# Patient Record
Sex: Male | Born: 1955 | Race: White | Hispanic: No | Marital: Married | Smoking: Former smoker
Health system: Southern US, Community
[De-identification: ages and names within clinical notes are randomized; demographics above are authoritative.]

## PROBLEM LIST (undated history)

## (undated) DIAGNOSIS — G4730 Sleep apnea, unspecified: Secondary | ICD-10-CM

## (undated) DIAGNOSIS — F419 Anxiety disorder, unspecified: Secondary | ICD-10-CM

## (undated) DIAGNOSIS — Z87442 Personal history of urinary calculi: Secondary | ICD-10-CM

## (undated) DIAGNOSIS — E7800 Pure hypercholesterolemia, unspecified: Secondary | ICD-10-CM

## (undated) DIAGNOSIS — I10 Essential (primary) hypertension: Secondary | ICD-10-CM

## (undated) DIAGNOSIS — Z8489 Family history of other specified conditions: Secondary | ICD-10-CM

## (undated) DIAGNOSIS — M1990 Unspecified osteoarthritis, unspecified site: Secondary | ICD-10-CM

---
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
## 2004-04-10 ENCOUNTER — Ambulatory Visit: Payer: Self-pay | Admitting: Family Medicine

## 2004-10-20 ENCOUNTER — Emergency Department: Payer: Self-pay | Admitting: Emergency Medicine

## 2004-11-07 ENCOUNTER — Ambulatory Visit: Payer: Self-pay | Admitting: Specialist

## 2009-06-25 HISTORY — PX: COLONOSCOPY: SHX174

## 2010-05-30 ENCOUNTER — Ambulatory Visit: Payer: Self-pay | Admitting: Gastroenterology

## 2010-06-01 LAB — PATHOLOGY REPORT

## 2012-06-16 ENCOUNTER — Emergency Department: Payer: Self-pay | Admitting: Emergency Medicine

## 2018-12-28 ENCOUNTER — Ambulatory Visit
Admission: EM | Admit: 2018-12-28 | Discharge: 2018-12-28 | Disposition: A | Discharge status: Home / Self Care | Payer: BC Managed Care – PPO | Attending: Family Medicine | Admitting: Family Medicine

## 2018-12-28 ENCOUNTER — Other Ambulatory Visit: Payer: Self-pay

## 2018-12-28 ENCOUNTER — Encounter: Payer: Self-pay | Admitting: Emergency Medicine

## 2018-12-28 DIAGNOSIS — M7662 Achilles tendinitis, left leg: Secondary | ICD-10-CM | POA: Diagnosis not present

## 2018-12-28 HISTORY — DX: Essential (primary) hypertension: I10

## 2018-12-28 MED ORDER — MELOXICAM 15 MG PO TABS: 15.0000 mg | ORAL_TABLET | Freq: Every day | ORAL | 0 refills | Status: DC | PRN

## 2018-12-28 NOTE — Discharge Instructions (Addendum)
Take medication as prescribed. Rest. Drink plenty of fluids. Ice. Avoid strenuous activity. Avoid stepping motions like ladders as able. Use boot.   Follow up with podiatry in one week as discussed. See above.   Follow up with your primary care physician this week as needed. Return to Urgent care for new or worsening concerns.

## 2018-12-28 NOTE — ED Provider Notes (Addendum)
[facility] ____________________________________________  Time seen: Approximately 12:18 PM  I have reviewed the triage vital signs and the nursing notes.   HISTORY  Chief Complaint Foot Pain   HPI Spencer Smith is a 63 y.o. male presenting for evaluation of left posterior heel ankle pain since Friday.  Patient reports he has been very active recently and has been helping his father-in-law to repair a shed.  Does report he has been going up and down ladders which she does not typically do.  States this past Friday he noticed some pain to his heel while he was fishing.  Reports pain increased yesterday when he got out of bed.  States pain did improve during the day as he got active but then throbbing last night.  Denies any fall, direct injury or direct trauma.  Denies any abrupt onset.  Denies any decreased range of motion or swelling or break in skin.  Denies history of same.  No alleviating measures attempted.  Pain is worse with direct palpation.  Reports otherwise doing well.  Denies chest pain, shortness of breath, cough, fever or recent sickness.   Past Medical History:  Diagnosis Date  . Hypertension     There are no active problems to display for this patient.   History reviewed. No pertinent surgical history.   No current facility-administered medications for this encounter.   Current Outpatient Medications:  .  sertraline (ZOLOFT) 100 MG tablet, Take by mouth., Disp: , Rfl:  .  meloxicam (MOBIC) 15 MG tablet, Take 1 tablet (15 mg total) by mouth daily as needed for pain., Disp: 14 tablet, Rfl: 0  Allergies Patient has no known allergies.  History reviewed. No pertinent family history.  Social History Social History   Tobacco Use  . Smoking status: Former Research scientist (life sciences)  . Smokeless tobacco: Never Used  Substance Use Topics  . Alcohol use: Not Currently  . Drug use: Never    Review of Systems Constitutional: No fever ENT: No sore throat.  Cardiovascular: Denies chest pain. Respiratory: Denies shortness of breath. Gastrointestinal: No abdominal pain.   Musculoskeletal: Positive left heel pain. Skin: Negative for rash.   ____________________________________________   PHYSICAL EXAM:  VITAL SIGNS: ED Triage Vitals  Enc Vitals Group     BP 12/28/18 1132 125/76     Pulse Rate 12/28/18 1132 (!) 57     Resp 12/28/18 1132 18     Temp 12/28/18 1130 98.2 F (36.8 C)     Temp Source 12/28/18 1130 Oral     SpO2 12/28/18 1132 97 %     Weight 12/28/18 1131 223 lb (101.2 kg)     Height 12/28/18 1131 6\' 1"  (1.854 m)     Head Circumference --      Peak Flow --      Pain Score 12/28/18 1131 7     Pain Loc --      Pain Edu? --      Excl. in [place]? --     Constitutional: Alert and oriented. Well appearing and in no acute distress. ENT      Head: Normocephalic and atraumatic. Cardiovascular: Normal rate, regular rhythm. Grossly normal heart sounds.  Good peripheral circulation. Respiratory: Normal respiratory effort without tachypnea nor retractions. Breath sounds are clear and equal bilaterally. No wheezes, rales, rhonchi. Musculoskeletal: Steady gait.  Bilateral distal pedal pulses equal nasal palpated. Except: Left posterior heel at insertion of Achilles tendon mild to moderate tenderness to direct palpation, no bony tenderness, negative left  Thompson squeeze test, able to fully plantarflex and dorsiflex left foot with minimal pain to posterior heel, left lower extremity otherwise nontender, no edema.  Neurologic:  Normal speech and language.  Speech is normal. No gait instability.  Skin:  Skin is warm, dry Psychiatric: Mood and affect are normal. Speech and behavior are normal. Patient exhibits appropriate insight and judgment   ___________________________________________   LABS (all labs ordered are listed, but only abnormal results are displayed)  Labs Reviewed - No data to display  ____________________________________________   PROCEDURES Procedures     INITIAL IMPRESSION / ASSESSMENT AND PLAN / ED COURSE  Pertinent labs & imaging results that were available during my care of the patient were reviewed by me and considered in my medical decision making (see chart for details).  Very well-appearing patient.  No acute distress.  Recent increase of activity going up and down steps.  Onset of left posterior heel pain at distal Achilles insertion point.  No bony tenderness.  No trauma.  Will defer x-ray at this time, patient agrees.  Suspect left Achilles tendinitis.  Will treat with Mobic, ice, rest, supportive care.  Cam walker boot given.  Follow-up with podiatry in 1 week for continued pain.Discussed indication, risks and benefits of medications with patient.  Discussed follow up with Primary care physician this week. Discussed follow up and return parameters including no resolution or any worsening concerns. Patient verbalized understanding and agreed to plan.   ____________________________________________   FINAL CLINICAL IMPRESSION(S) / ED DIAGNOSES  Final diagnoses:  Left Achilles tendinitis     ED Discharge Orders         Ordered    meloxicam (MOBIC) 15 MG tablet  Daily PRN     12/28/18 1155           Note: This dictation was prepared with Dragon dictation along with smaller phrase technology. Any transcriptional errors that result from this process are unintentional.         Marylene Land, NP 12/28/18 1225

## 2018-12-28 NOTE — ED Triage Notes (Signed)
Patient states Friday afternoon his left heel started hurting him. He denies injury and states it is painful to walk.

## 2020-03-24 ENCOUNTER — Other Ambulatory Visit: Payer: Self-pay | Admitting: Urology

## 2020-03-24 DIAGNOSIS — R972 Elevated prostate specific antigen [PSA]: Secondary | ICD-10-CM

## 2020-04-13 ENCOUNTER — Ambulatory Visit
Admission: RE | Admit: 2020-04-13 | Discharge: 2020-04-13 | Disposition: A | Discharge status: Home / Self Care | Payer: BC Managed Care – PPO | Source: Ambulatory Visit | Attending: Urology | Admitting: Urology

## 2020-04-13 ENCOUNTER — Other Ambulatory Visit: Payer: Self-pay

## 2020-04-13 DIAGNOSIS — R972 Elevated prostate specific antigen [PSA]: Secondary | ICD-10-CM | POA: Insufficient documentation

## 2020-04-13 MED ORDER — GADOBUTROL 1 MMOL/ML IV SOLN
10.0000 mL | Freq: Once | INTRAVENOUS | Status: AC | PRN
  Administered 2020-04-13: 10 mL via INTRAVENOUS

## 2020-05-16 ENCOUNTER — Encounter
Admission: RE | Admit: 2020-05-16 | Discharge: 2020-05-16 | Disposition: A | Discharge status: Home / Self Care | Payer: BC Managed Care – PPO | Source: Ambulatory Visit | Attending: Urology | Admitting: Urology

## 2020-05-16 ENCOUNTER — Other Ambulatory Visit: Payer: Self-pay

## 2020-05-16 HISTORY — DX: Personal history of urinary calculi: Z87.442

## 2020-05-16 HISTORY — DX: Pure hypercholesterolemia, unspecified: E78.00

## 2020-05-16 HISTORY — DX: Anxiety disorder, unspecified: F41.9

## 2020-05-16 HISTORY — DX: Sleep apnea, unspecified: G47.30

## 2020-05-16 NOTE — Patient Instructions (Addendum)
Your procedure is scheduled on: Thursday, December 2 Report to the Registration Desk on the 1st floor of the [facility]. To find out your arrival time, please call [phone] between 1PM - 3PM on: Wednesday, December 1  REMEMBER: Instructions that are not followed completely may result in serious medical risk, up to and including death; or upon the discretion of your surgeon and anesthesiologist your surgery may need to be rescheduled.  Do not eat food after midnight the night before surgery.  No gum chewing, lozengers or hard candies.  You may however, drink CLEAR liquids up to 2 hours before you are scheduled to arrive for your surgery. Do not drink anything within 2 hours of your scheduled arrival time.  Clear liquids include: - water  - apple juice without pulp - gatorade (not RED, PURPLE, OR BLUE) - black coffee or tea (Do NOT add milk or creamers to the coffee or tea) Do NOT drink anything that is not on this list.  TAKE THESE MEDICATIONS THE MORNING OF SURGERY WITH A SIP OF WATER:  1.  Amlodipine 2.  Buspirone (Buspar) 3.  Fluoxetine (Prozac)  One week prior to surgery: starting November 25 Stop aspirin and Anti-inflammatories (NSAIDS) such as Advil, Aleve, Ibuprofen, Motrin, Naproxen, Naprosyn and Aspirin based products such as Excedrin, Goodys Powder, BC Powder. Stop ANY OVER THE COUNTER supplements until after surgery. Stop fish oil (However, you may continue taking Vitamin D and multivitamin up until the day before surgery.)  No Alcohol for 24 hours before or after surgery.  No Smoking including e-cigarettes for 24 hours prior to surgery.  No chewable tobacco products for at least 6 hours prior to surgery.  No nicotine patches on the day of surgery.  Do not use any "recreational" drugs for at least a week prior to your surgery.  Please be advised that the combination of cocaine and anesthesia may have negative outcomes, up to and including death. If you test  positive for cocaine, your surgery will be cancelled.  On the morning of surgery brush your teeth with toothpaste and water, you may rinse your mouth with mouthwash if you wish. Do not swallow any toothpaste or mouthwash.  Do not wear jewelry.  Do not wear lotions, powders, or perfumes.   Do not bring valuables to the hospital. [facility] is not responsible for any missing/lost belongings or valuables.   Fleets enema as directed prior to arrival to the hospital on the morning of surgery.  Bring your C-PAP to the hospital with you in case you may have to spend the night.   Notify your doctor if there is any change in your medical condition (cold, fever, infection).  Wear comfortable clothing (specific to your surgery type) to the hospital.  If you are being discharged the day of surgery, you will not be allowed to drive home. You will need a responsible adult (18 years or older) to drive you home and stay with you that night.   If you are taking public transportation, you will need to have a responsible adult (18 years or older) with you. Please confirm with your physician that it is acceptable to use public transportation.   Please call the [facility] at [phone] if you have any questions about these instructions.  Visitation Policy:  Patients undergoing a surgery or procedure may have one family member or support person with them as long as that person is not COVID-19 positive or experiencing its symptoms.  That  person may remain in the waiting area during the procedure.

## 2020-05-17 ENCOUNTER — Encounter
Admission: RE | Admit: 2020-05-17 | Discharge: 2020-05-17 | Disposition: A | Discharge status: Home / Self Care | Payer: BC Managed Care – PPO | Source: Ambulatory Visit | Attending: Urology | Admitting: Urology

## 2020-05-17 DIAGNOSIS — I10 Essential (primary) hypertension: Secondary | ICD-10-CM | POA: Diagnosis not present

## 2020-05-17 DIAGNOSIS — Z01818 Encounter for other preprocedural examination: Secondary | ICD-10-CM | POA: Diagnosis present

## 2020-05-17 LAB — CBC
HCT: 43.2 % (ref 39.0–52.0)
Hemoglobin: 15 g/dL (ref 13.0–17.0)
MCH: 31.1 pg (ref 26.0–34.0)
MCHC: 34.7 g/dL (ref 30.0–36.0)
MCV: 89.4 fL (ref 80.0–100.0)
Platelets: 195 10*3/uL (ref 150–400)
RBC: 4.83 MIL/uL (ref 4.22–5.81)
RDW: 12.3 % (ref 11.5–15.5)
WBC: 5.5 10*3/uL (ref 4.0–10.5)
nRBC: 0 % (ref 0.0–0.2)

## 2020-05-17 LAB — BASIC METABOLIC PANEL WITH GFR
Anion gap: 10 (ref 5–15)
BUN: 12 mg/dL (ref 8–23)
CO2: 26 mmol/L (ref 22–32)
Calcium: 9.3 mg/dL (ref 8.9–10.3)
Chloride: 102 mmol/L (ref 98–111)
Creatinine, Ser: 0.91 mg/dL (ref 0.61–1.24)
GFR, Estimated: >60 mL/min (ref >60)
Glucose, Bld: 98 mg/dL (ref 70–99)
Potassium: 3.8 mmol/L (ref 3.5–5.1)
Sodium: 138 mmol/L (ref 135–145)

## 2020-05-18 NOTE — H&P (Signed)
NAME: Spencer Smith, Spencer Smith. MEDICAL RECORD [id number] ACCOUNT [id number] DATE OF BIRTH:Nov 12, 1955 FACILITY: [facility] LOCATION: [facility] PHYSICIAN:Hendryx Ricke R. Izabella Marcantel, MD  HISTORY AND PHYSICAL  DATE OF ADMISSION:  05/26/2020  Same day surgery, 05/26/2020  CHIEF COMPLAINT:  Elevated PSA and abnormal prostate MRI.  HISTORY OF PRESENT ILLNESS:  The patient is a 64 year old white male with an elevated PSA of 4.1 ng/mL, which was evaluated with prostate MRI scan.  The MRI indicated 2 regions of interest.  The first region of interest was a PI-RADS category 4 lesion  in the right anterior zone measuring 0.94 mL.  The region of interest #2 was a PI-RADS category 4 lesion of the left posterior lateral and left anterior peripheral zone in the mid gland with a volume of 0.61 mL.  The total prostate volume was 69.95 mL.   The patient comes in now for UroNav fusion biopsy of the prostate.  PAST MEDICAL HISTORY: ALLERGIES:  No drug allergies.  CURRENT MEDICATIONS:  Aspirin, atorvastatin, BuSpar, fluoxetine and HCTZ.  PAST SURGICAL HISTORY:  Tonsillectomy in 1961.  PAST AND CURRENT MEDICAL CONDITIONS: 1.  Hypertension. 2.  Hypercholesterolemia. 3.  Depression with anxiety. 4.  Sleep apnea. 5.  Obesity.  REVIEW OF SYSTEMS:  The patient denies chest pain, shortness of breath, diabetes, stroke or heart disease.  SOCIAL HISTORY:  The patient quit smoking in 2020 with a 20-pack-year history.  He denied alcohol use.  FAMILY HISTORY:  Father died at age 39 with dementia.  Mother is living, age 81 with hypertension and hypercholesterolemia.  There is no family history of prostate cancer, other urological cancer or kidney stones or kidney disease.  PHYSICAL EXAMINATION: VITAL SIGNS:  Height 6 feet 0 inches, weight 219 pounds, BMI 30. GENERAL:  Obese white male in no acute distress. HEENT:  Sclerae were clear.  Pupils are equally round, reactive to light and accommodation.  Extraocular movements were  intact. NECK:  No palpable cervical adenopathy.  No audible carotid bruits. LYMPHATIC:  No palpable cervical or inguinal adenopathy. PULMONARY:  Lungs were clear to auscultation with normal respiratory effort without intercostal retractions. CARDIOVASCULAR:  Regular rhythm and rate without audible murmurs or gallops. GASTROINTESTINAL:  Abdomen was soft, without CVA tenderness. GENITOURINARY:  Circumcised.  Testes were smooth, nontender, approximately 18 mL in size each. RECTAL:  Greater than 50 gram smooth, nontender prostate. NEUROMUSCULAR:  Alert and oriented x3.  IMPRESSION: 1.  Elevated PSA. 2.  Abnormal prostate MRI scan.  PLAN:  UroNav fusion biopsy of prostate.  HN/NUANCE  D:05/17/2020 T:05/17/2020 JOB:[id number]

## 2020-05-24 ENCOUNTER — Other Ambulatory Visit
Admission: RE | Admit: 2020-05-24 | Discharge: 2020-05-24 | Disposition: A | Discharge status: Home / Self Care | Payer: BC Managed Care – PPO | Source: Ambulatory Visit | Attending: Urology | Admitting: Urology

## 2020-05-24 ENCOUNTER — Other Ambulatory Visit: Payer: Self-pay

## 2020-05-24 DIAGNOSIS — Z20822 Contact with and (suspected) exposure to covid-19: Secondary | ICD-10-CM | POA: Diagnosis present

## 2020-05-24 DIAGNOSIS — Z01812 Encounter for preprocedural laboratory examination: Secondary | ICD-10-CM | POA: Diagnosis not present

## 2020-05-24 LAB — SARS CORONAVIRUS 2 (TAT 6-24 HRS): SARS Coronavirus 2: NEGATIVE

## 2020-05-25 MED ORDER — CHLORHEXIDINE GLUCONATE 0.12 % MT SOLN: 15.0000 mL | Freq: Once | OROMUCOSAL | Status: AC

## 2020-05-25 MED ORDER — CEFAZOLIN SODIUM-DEXTROSE 1-4 GM/50ML-% IV SOLN
1.0000 g | Freq: Once | INTRAVENOUS | Status: AC
  Administered 2020-05-26: 2 g via INTRAVENOUS

## 2020-05-25 MED ORDER — FAMOTIDINE 20 MG PO TABS: 20.0000 mg | ORAL_TABLET | Freq: Once | ORAL | Status: AC

## 2020-05-25 MED ORDER — GENTAMICIN SULFATE 40 MG/ML IJ SOLN
80.0000 mg | Freq: Once | INTRAVENOUS | Status: DC
  Filled 2020-05-25: qty 2

## 2020-05-25 MED ORDER — GENTAMICIN IN SALINE 1.6-0.9 MG/ML-% IV SOLN
80.0000 mg | INTRAVENOUS | Status: AC
  Administered 2020-05-26: 80 mg via INTRAVENOUS
  Filled 2020-05-25: qty 50

## 2020-05-25 MED ORDER — ORAL CARE MOUTH RINSE: 15.0000 mL | Freq: Once | OROMUCOSAL | Status: AC

## 2020-05-25 MED ORDER — FLEET ENEMA 7-19 GM/118ML RE ENEM
1.0000 | ENEMA | Freq: Once | RECTAL | Status: AC
  Administered 2020-05-26: 1 via RECTAL

## 2020-05-25 MED ORDER — LACTATED RINGERS IV SOLN: INTRAVENOUS | Status: DC

## 2020-05-26 ENCOUNTER — Encounter: Payer: Self-pay | Admitting: Urology

## 2020-05-26 ENCOUNTER — Ambulatory Visit
Admission: RE | Admit: 2020-05-26 | Discharge: 2020-05-26 | Disposition: A | Discharge status: Home / Self Care | Payer: Medicare Other | Attending: Urology | Admitting: Urology

## 2020-05-26 ENCOUNTER — Other Ambulatory Visit: Payer: Self-pay | Admitting: Urology

## 2020-05-26 ENCOUNTER — Other Ambulatory Visit: Payer: Self-pay

## 2020-05-26 ENCOUNTER — Encounter
Admission: RE | Disposition: A | Discharge status: Home / Self Care | Payer: Self-pay | Source: Home / Self Care | Attending: Urology

## 2020-05-26 ENCOUNTER — Ambulatory Visit: Payer: Medicare Other | Admitting: Certified Registered"

## 2020-05-26 DIAGNOSIS — Z8249 Family history of ischemic heart disease and other diseases of the circulatory system: Secondary | ICD-10-CM | POA: Diagnosis not present

## 2020-05-26 DIAGNOSIS — Z87442 Personal history of urinary calculi: Secondary | ICD-10-CM | POA: Diagnosis not present

## 2020-05-26 DIAGNOSIS — I10 Essential (primary) hypertension: Secondary | ICD-10-CM | POA: Insufficient documentation

## 2020-05-26 DIAGNOSIS — Z820 Family history of epilepsy and other diseases of the nervous system: Secondary | ICD-10-CM | POA: Insufficient documentation

## 2020-05-26 DIAGNOSIS — Z87891 Personal history of nicotine dependence: Secondary | ICD-10-CM | POA: Diagnosis not present

## 2020-05-26 DIAGNOSIS — R9389 Abnormal findings on diagnostic imaging of other specified body structures: Secondary | ICD-10-CM | POA: Diagnosis not present

## 2020-05-26 DIAGNOSIS — R972 Elevated prostate specific antigen [PSA]: Secondary | ICD-10-CM | POA: Diagnosis present

## 2020-05-26 DIAGNOSIS — Z79899 Other long term (current) drug therapy: Secondary | ICD-10-CM | POA: Diagnosis not present

## 2020-05-26 DIAGNOSIS — Z8349 Family history of other endocrine, nutritional and metabolic diseases: Secondary | ICD-10-CM | POA: Insufficient documentation

## 2020-05-26 DIAGNOSIS — Z7982 Long term (current) use of aspirin: Secondary | ICD-10-CM | POA: Insufficient documentation

## 2020-05-26 HISTORY — PX: PROSTATE BIOPSY: SHX241

## 2020-05-26 SURGERY — BIOPSY, PROSTATE
Anesthesia: General

## 2020-05-26 MED ORDER — FENTANYL CITRATE (PF) 100 MCG/2ML IJ SOLN: 25.0000 ug | INTRAMUSCULAR | Status: DC | PRN

## 2020-05-26 MED ORDER — MIDAZOLAM HCL 5 MG/5ML IJ SOLN
INTRAMUSCULAR | Status: DC | PRN
  Administered 2020-05-26: 2 mg via INTRAVENOUS

## 2020-05-26 MED ORDER — DEXAMETHASONE SODIUM PHOSPHATE 10 MG/ML IJ SOLN
INTRAMUSCULAR | Status: DC | PRN
  Administered 2020-05-26: 4 mg via INTRAVENOUS

## 2020-05-26 MED ORDER — FENTANYL CITRATE (PF) 250 MCG/5ML IJ SOLN
INTRAMUSCULAR | Status: AC
  Filled 2020-05-26: qty 5

## 2020-05-26 MED ORDER — ONDANSETRON HCL 4 MG/2ML IJ SOLN
INTRAMUSCULAR | Status: DC | PRN
  Administered 2020-05-26: 4 mg via INTRAVENOUS

## 2020-05-26 MED ORDER — DEXAMETHASONE SODIUM PHOSPHATE 10 MG/ML IJ SOLN
INTRAMUSCULAR | Status: AC
  Filled 2020-05-26: qty 1

## 2020-05-26 MED ORDER — BELLADONNA ALKALOIDS-OPIUM 16.2-60 MG RE SUPP
RECTAL | Status: AC
  Filled 2020-05-26: qty 1

## 2020-05-26 MED ORDER — ONDANSETRON HCL 4 MG/2ML IJ SOLN
INTRAMUSCULAR | Status: AC
  Filled 2020-05-26: qty 2

## 2020-05-26 MED ORDER — PHENYLEPHRINE HCL (PRESSORS) 10 MG/ML IV SOLN
INTRAVENOUS | Status: AC
  Filled 2020-05-26: qty 1

## 2020-05-26 MED ORDER — GLYCOPYRROLATE 0.2 MG/ML IJ SOLN
INTRAMUSCULAR | Status: AC
  Filled 2020-05-26: qty 2

## 2020-05-26 MED ORDER — ROCURONIUM BROMIDE 100 MG/10ML IV SOLN
INTRAVENOUS | Status: DC | PRN
  Administered 2020-05-26: 10 mg via INTRAVENOUS
  Administered 2020-05-26: 30 mg via INTRAVENOUS

## 2020-05-26 MED ORDER — FENTANYL CITRATE (PF) 100 MCG/2ML IJ SOLN
INTRAMUSCULAR | Status: DC | PRN
  Administered 2020-05-26: 50 ug via INTRAVENOUS

## 2020-05-26 MED ORDER — ONDANSETRON HCL 4 MG/2ML IJ SOLN: 4.0000 mg | Freq: Once | INTRAMUSCULAR | Status: DC | PRN

## 2020-05-26 MED ORDER — CHLORHEXIDINE GLUCONATE 0.12 % MT SOLN
OROMUCOSAL | Status: AC
  Administered 2020-05-26: 15 mL via OROMUCOSAL
  Filled 2020-05-26: qty 15

## 2020-05-26 MED ORDER — ROCURONIUM BROMIDE 10 MG/ML (PF) SYRINGE
PREFILLED_SYRINGE | INTRAVENOUS | Status: AC
  Filled 2020-05-26: qty 10

## 2020-05-26 MED ORDER — SUCCINYLCHOLINE CHLORIDE 200 MG/10ML IV SOSY
PREFILLED_SYRINGE | INTRAVENOUS | Status: AC
  Filled 2020-05-26: qty 10

## 2020-05-26 MED ORDER — LEVOFLOXACIN 500 MG PO TABS: 500.0000 mg | ORAL_TABLET | Freq: Every day | ORAL | 0 refills | Status: DC

## 2020-05-26 MED ORDER — LIDOCAINE HCL (PF) 2 % IJ SOLN
INTRAMUSCULAR | Status: AC
  Filled 2020-05-26: qty 5

## 2020-05-26 MED ORDER — FAMOTIDINE 20 MG PO TABS
ORAL_TABLET | ORAL | Status: AC
  Administered 2020-05-26: 20 mg via ORAL
  Filled 2020-05-26: qty 1

## 2020-05-26 MED ORDER — SUGAMMADEX SODIUM 200 MG/2ML IV SOLN
INTRAVENOUS | Status: DC | PRN
  Administered 2020-05-26 (×3): 100 mg via INTRAVENOUS

## 2020-05-26 MED ORDER — PROPOFOL 10 MG/ML IV BOLUS
INTRAVENOUS | Status: AC
  Filled 2020-05-26: qty 40

## 2020-05-26 MED ORDER — GLYCOPYRROLATE 0.2 MG/ML IJ SOLN
INTRAMUSCULAR | Status: DC | PRN
  Administered 2020-05-26: .2 mg via INTRAVENOUS

## 2020-05-26 MED ORDER — MIDAZOLAM HCL 2 MG/2ML IJ SOLN
INTRAMUSCULAR | Status: AC
  Filled 2020-05-26: qty 2

## 2020-05-26 MED ORDER — SUCCINYLCHOLINE CHLORIDE 200 MG/10ML IV SOSY
PREFILLED_SYRINGE | INTRAVENOUS | Status: DC | PRN
  Administered 2020-05-26: 100 mg via INTRAVENOUS

## 2020-05-26 MED ORDER — LIDOCAINE HCL URETHRAL/MUCOSAL 2 % EX GEL
CUTANEOUS | Status: AC
  Filled 2020-05-26: qty 10

## 2020-05-26 MED ORDER — CEFAZOLIN SODIUM-DEXTROSE 1-4 GM/50ML-% IV SOLN
INTRAVENOUS | Status: AC
  Filled 2020-05-26: qty 50

## 2020-05-26 MED ORDER — PROPOFOL 10 MG/ML IV BOLUS
INTRAVENOUS | Status: DC | PRN
  Administered 2020-05-26: 130 mg via INTRAVENOUS

## 2020-05-26 SURGICAL SUPPLY — 20 items
COVER MAYO STAND REUSABLE (DRAPES) ×3 IMPLANT
COVER TRANSDUCER ULTRASOUND (MISCELLANEOUS) ×2 IMPLANT
GLOVE BIO SURGEON STRL SZ7 (GLOVE) ×6 IMPLANT
GUIDE NDL ENDOCAV 16-18 CVR (NEEDLE) IMPLANT
GUIDE NDL URONAV ULTRASND S (MISCELLANEOUS) IMPLANT
GUIDE NEEDLE ENDOCAV 16-18 CVR (NEEDLE) IMPLANT
GUIDE NEEDLE URONAV ULTRASND S (MISCELLANEOUS) ×1 IMPLANT
INST BIOPSY MAXCORE 18GX25 (NEEDLE) ×3 IMPLANT
MANIFOLD NEPTUNE II (INSTRUMENTS) ×3 IMPLANT
NDL GUIDE BIOPSY 644068 (NEEDLE) IMPLANT
NEEDLE GUIDE BIOPSY 644068 (NEEDLE) IMPLANT
PROBE BIOSP ALOKA ALPHA6 PROST (MISCELLANEOUS) ×2 IMPLANT
PROBE URONAV BK 8808E 8818 HLD (MISCELLANEOUS) IMPLANT
STRAP SAFETY BODY (MISCELLANEOUS) ×3 IMPLANT
SURGILUBE 2OZ TUBE FLIPTOP (MISCELLANEOUS) ×3 IMPLANT
TOWEL OR 17X26 4PK STRL BLUE (TOWEL DISPOSABLE) ×3 IMPLANT
URONAV BK 8808E 8818 PROBE HLD (MISCELLANEOUS) ×3
URONAV MRI FUSION TWO PATIENTS (MISCELLANEOUS) ×2 IMPLANT
URONAV ULTRASOUND (MISCELLANEOUS) ×2 IMPLANT
URONAV ULTRASOUND NDL GUIDE S (MISCELLANEOUS) ×3

## 2020-05-26 NOTE — Anesthesia Preprocedure Evaluation (Signed)
Anesthesia Evaluation  Patient identified by MRN, date of birth, ID band Patient awake    Reviewed: Allergy & Precautions, NPO status , Patient's Chart, lab work & pertinent test results  Airway Mallampati: III  TM Distance: <3 FB     Dental  (+) Caps   Pulmonary sleep apnea , former smoker,    Pulmonary exam normal        Cardiovascular hypertension, Normal cardiovascular exam     Neuro/Psych PSYCHIATRIC DISORDERS Anxiety negative neurological ROS     GI/Hepatic negative GI ROS, Neg liver ROS,   Endo/Other  negative endocrine ROS  Renal/GU negative Renal ROS     Musculoskeletal negative musculoskeletal ROS (+)   Abdominal Normal abdominal exam  (+)   Peds negative pediatric ROS (+)  Hematology negative hematology ROS (+)   Anesthesia Other Findings Past Medical History: No date: Anxiety No date: History of kidney stones No date: Hypercholesterolemia No date: Hypertension No date: Sleep apnea  Reproductive/Obstetrics                             Anesthesia Physical Anesthesia Plan  ASA: II  Anesthesia Plan: General   Post-op Pain Management:    Induction: Intravenous  PONV Risk Score and Plan:   Airway Management Planned: Oral ETT  Additional Equipment:   Intra-op Plan:   Post-operative Plan: Extubation in OR  Informed Consent: I have reviewed the patients History and Physical, chart, labs and discussed the procedure including the risks, benefits and alternatives for the proposed anesthesia with the patient or authorized representative who has indicated his/her understanding and acceptance.     Dental advisory given  Plan Discussed with: CRNA and Anesthesiologist  Anesthesia Plan Comments:         Anesthesia Quick Evaluation

## 2020-05-26 NOTE — Op Note (Signed)
Preoperative diagnosis:  1. Elevated PSA (R97.2)                                              2. Abnormal prostate MRI (D40.0)  Postoperative diagnosis: Same  Procedure: Uronav image guided fusion biopsy of the prostate (CPT 55700, [phone])  Surgeon: Otelia Limes. Yves Dill MD  Anesthesia: General  Indications:See the history and physical. After informed consent the above procedure(s) were requested     Technique and findings: After adequate general anesthesia been obtained the patient was placed into left lateral decubitus position and DRE was performed.  The rectal vault was noted to be clear.  Transrectal ultrasound probe was placed and ultrasound images acquired.  The ultrasound images were then fused with the MRI images and all 3 regions of interest were identified.  Three core biopsies were then obtained from region of interest #1.  Next, 3 core biopsies were taken from region of interest #2 followed by 3 core biopsies from region of interest #3.  At this point standard 12 core systematic core biopsies were performed.  Blood loss was minimal.  The ultrasound probe was removed and procedure terminated.  The patient was then transferred to the recovery room in stable condition.

## 2020-05-26 NOTE — Anesthesia Procedure Notes (Signed)
Procedure Name: Intubation Date/Time: 05/26/2020 7:37 AM Performed by: Adalberto Ill, CRNA Pre-anesthesia Checklist: Patient identified, Emergency Drugs available, Suction available, Patient being monitored and Timeout performed Patient Re-evaluated:Patient Re-evaluated prior to induction Oxygen Delivery Method: Circle system utilized Preoxygenation: Pre-oxygenation with 100% oxygen Induction Type: IV induction Ventilation: Mask ventilation without difficulty Laryngoscope Size: Mac, 3 and McGraph Grade View: Grade I Tube type: Oral Tube size: 7.5 mm Number of attempts: 1 (brief ) Airway Equipment and Method: Stylet Placement Confirmation: ETT inserted through vocal cords under direct vision,  positive ETCO2 and breath sounds checked- equal and bilateral Secured at: 23 cm Tube secured with: Tape Dental Injury: Teeth and Oropharynx as per pre-operative assessment

## 2020-05-26 NOTE — H&P (Signed)
Date of Initial H&P: 05/17/20  History reviewed, patient examined, no change in status, stable for surgery.

## 2020-05-26 NOTE — Discharge Instructions (Addendum)
AMBULATORY SURGERY  DISCHARGE INSTRUCTIONS   1) The drugs that you were given will stay in your system until tomorrow so for the next 24 hours you should not:  A) Drive an automobile B) Make any legal decisions C) Drink any alcoholic beverage   2) You may resume regular meals tomorrow.  Today it is better to start with liquids and gradually work up to solid foods.  You may eat anything you prefer, but it is better to start with liquids, then soup and crackers, and gradually work up to solid foods.   3) Please notify your doctor immediately if you have any unusual bleeding, trouble breathing, redness and pain at the surgery site, drainage, fever, or pain not relieved by medication.    4) Additional Instructions:   Please contact your physician with any problems or Same Day Surgery at [phone], Monday through Friday 6 am to 4 pm, or [place] [facility] number at [phone].Prostate-Specific Antigen Test Why am I having this test? The prostate-specific antigen (PSA) test is a screening test for prostate cancer. It can identify early signs of prostate cancer, which may allow for more effective treatment. Your health care provider may recommend that you have a PSA test starting at age 61 or that you have one earlier or later, depending on your risk factors for prostate cancer. You may also have a PSA test:  To monitor treatment of prostate cancer.  To check whether prostate cancer has returned after treatment.  If you have signs of other conditions that can affect PSA levels, such as: ? An enlarged prostate that is not caused by cancer (benign prostatic hyperplasia, BPH). This condition is very common in older men. ? A prostate infection. What is being tested? This test measures the amount of PSA in your blood. PSA is a protein that is made in the prostate. The prostate naturally produces more PSA as you age, but very high levels may be a sign of a medical  condition. What kind of sample is taken?  A blood sample is required for this test. It is usually collected by inserting a needle into a blood vessel or by sticking a finger with a small needle. Blood for this test should be drawn before having an exam of the prostate. How do I prepare for this test? Do not ejaculate starting 24 hours before your test, or as long as told by your health care provider. Tell a health care provider about:  Any allergies you have.  All medicines you are taking, including vitamins, herbs, eye drops, creams, and over-the-counter medicines. This also includes: ? Medicines to assist with hair growth, such as finasteride. ? Any recent exposure to a medicine called diethylstilbestrol.  Any blood disorders you have.  Any recent procedures you have had, especially any procedures involving the prostate or rectum.  Any medical conditions you have.  Any recent urinary tract infections (UTIs) you have had. How are the results reported? Your test results will be reported as a value that indicates how much PSA is in your blood. This will be given as nanograms of PSA per milliliter of blood (ng/mL). Your health care provider will compare your results to normal ranges that were established after testing a large group of people (reference ranges). Reference ranges may vary among labs and hospitals. PSA levels vary from person to person and generally increase with age. Because of this variation, there is no single PSA value that is considered normal for everyone. Instead,  PSA reference ranges are used to describe whether your PSA levels are considered low or high (elevated). Common reference ranges are:  Low: 0-2.5 ng/mL.  Slightly to moderately elevated: 2.6-10.0 ng/mL.  Moderately elevated: 10.0-19.9 ng/mL.  Significantly elevated: 20 ng/mL or greater. Sometimes, the test results may report that a condition is present when it is not present (false-positive result). What  do the results mean? A test result that is higher than 4 ng/mL may mean that you are at an increased risk for prostate cancer. However, a PSA test by itself is not enough to diagnose prostate cancer. High PSA levels may also be caused by the natural aging process, prostate infection, or BPH. PSA screening cannot tell you if your PSA is high due to cancer or a different cause. A prostate biopsy is the only way to diagnose prostate cancer. A risk of having the PSA test is diagnosing and treating prostate cancer that would never have caused any symptoms or problems (overdiagnosis and overtreatment). Talk with your health care provider about what your results mean. Questions to ask your health care provider Ask your health care provider, or the department that is doing the test:  When will my results be ready?  How will I get my results?  What are my treatment options?  What other tests do I need?  What are my next steps? Summary  The prostate-specific antigen (PSA) test is a screening test for prostate cancer.  Your health care provider may recommend that you have a PSA test starting at age 24 or that you have one earlier or later, depending on your risk factors for prostate cancer.  A test result that is higher than 4 ng/mL may mean that you are at an increased risk for prostate cancer. However, elevated levels can be caused by a number of conditions other than prostate cancer.  Talk with your health care provider about what your results mean. This information is not intended to replace advice given to you by your health care provider. Make sure you discuss any questions you have with your health care provider. Document Revised: 05/24/2017 Document Reviewed: 03/18/2017 Elsevier Patient Education  [place]. Transrectal Ultrasound-Guided Prostate Biopsy, Care After This sheet gives you information about how to care for yourself after your procedure. Your doctor may also give you  more specific instructions. If you have problems or questions, contact your doctor. What can I expect after the procedure? After the procedure, it is common to have:  Pain and discomfort in your butt, especially while sitting.  Pink-colored pee (urine), due to small amounts of blood in the pee.  Burning while peeing (urinating).  Blood in your poop (stool).  Bleeding from your butt.  Blood in your semen. Follow these instructions at home: Medicines  Take over-the-counter and prescription medicines only as told by your doctor.  If you were prescribed antibiotic medicine, take it as told by your doctor. Do not stop taking the antibiotic even if you start to feel better. Activity   Do not drive for 24 hours if you were given a medicine to help you relax (sedative) during your procedure.  Return to your normal activities as told by your doctor. Ask your doctor what activities are safe for you.  Ask your doctor when it is okay for you to have sex.  Do not lift anything that is heavier than 10 lb (4.5 kg), or the limit that you are told, until your doctor says that it is safe.  General instructions   Drink enough water to keep your pee pale yellow.  Watch your pee, poop, and semen for new bleeding or bleeding that gets worse.  Keep all follow-up visits as told by your doctor. This is important. Contact a doctor if you:  Have blood clots in your pee or poop.  Notice that your pee smells bad or unusual.  Have very bad belly pain.  Have trouble peeing.  Notice that your lower belly feels firm.  Have blood in your pee for more than 2 weeks after the procedure.  Have blood in your semen for more than 2 months after the procedure.  Have problems getting an erection.  Feel sick to your stomach (nauseous).  Throw up (vomit).  Have new or worse bleeding in your pee, poop, or semen. Get help right away if you:  Have a fever or chills.  Have bright red pee.  Have  very bad pain that does not get better with medicine.  Cannot pee. Summary  After this procedure, it is common to have pain and discomfort around your butt, especially while sitting.  You may have blood in your pee and poop.  It is common to have blood in your semen for 1-2 months.  If you were prescribed antibiotic medicine, take it as told by your doctor. Do not stop taking the antibiotic even if you start to feel better.  Get help right away if you have a fever or chills. This information is not intended to replace advice given to you by your health care provider. Make sure you discuss any questions you have with your health care provider. Document Revised: 10/01/2018 Document Reviewed: 04/09/2017 Elsevier Patient Education  [place].

## 2020-05-26 NOTE — Transfer of Care (Signed)
Immediate Anesthesia Transfer of Care Note  Patient: Spencer Smith  Procedure(s) Performed: PROSTATE BIOPSY URONAV (N/A )  Patient Location: PACU  Anesthesia Type:General  Level of Consciousness: awake, alert , oriented and patient cooperative  Airway & Oxygen Therapy: Patient Spontanous Breathing  Post-op Assessment: Report given to RN and Post -op Vital signs reviewed and stable  Post vital signs: Reviewed and stable  Last Vitals:  Vitals Value Taken Time  BP 130/77 05/26/20 0828  Temp 36.3 C 05/26/20 0828  Pulse 69 05/26/20 0831  Resp 10 05/26/20 0831  SpO2 98 % 05/26/20 0831  Vitals shown include unvalidated device data.  Last Pain:  Vitals:   05/26/20 0828  TempSrc:   PainSc: 0-No pain         Complications: No complications documented.

## 2020-05-30 LAB — SURGICAL PATHOLOGY

## 2020-05-31 NOTE — Anesthesia Postprocedure Evaluation (Signed)
Anesthesia Post Note  Patient: Spencer Smith  Procedure(s) Performed: PROSTATE BIOPSY URONAV (N/A )  Patient location during evaluation: PACU Anesthesia Type: General Level of consciousness: awake and alert and oriented Pain management: pain level controlled Vital Signs Assessment: post-procedure vital signs reviewed and stable Respiratory status: spontaneous breathing Cardiovascular status: blood pressure returned to baseline Anesthetic complications: no   No complications documented.   Last Vitals:  Vitals:   05/26/20 0853 05/26/20 0919  BP:  123/70  Pulse: 64 (!) 58  Resp: 14 16  Temp: 36.4 C   SpO2: 100% 98%    Last Pain:  Vitals:   05/27/20 0811  TempSrc:   PainSc: 0-No pain                 Bonetta Mostek

## 2020-06-02 LAB — SURGICAL PATHOLOGY

## 2020-10-03 DIAGNOSIS — R5383 Other fatigue: Secondary | ICD-10-CM | POA: Diagnosis not present

## 2020-10-10 DIAGNOSIS — I10 Essential (primary) hypertension: Secondary | ICD-10-CM | POA: Diagnosis not present

## 2020-10-10 DIAGNOSIS — R5383 Other fatigue: Secondary | ICD-10-CM | POA: Diagnosis not present

## 2020-10-10 DIAGNOSIS — E785 Hyperlipidemia, unspecified: Secondary | ICD-10-CM | POA: Diagnosis not present

## 2020-10-14 DIAGNOSIS — Z01812 Encounter for preprocedural laboratory examination: Secondary | ICD-10-CM | POA: Diagnosis not present

## 2020-10-18 DIAGNOSIS — K640 First degree hemorrhoids: Secondary | ICD-10-CM | POA: Diagnosis not present

## 2020-10-18 DIAGNOSIS — D120 Benign neoplasm of cecum: Secondary | ICD-10-CM | POA: Diagnosis not present

## 2020-10-18 DIAGNOSIS — K635 Polyp of colon: Secondary | ICD-10-CM | POA: Diagnosis not present

## 2020-10-18 DIAGNOSIS — Z1211 Encounter for screening for malignant neoplasm of colon: Secondary | ICD-10-CM | POA: Diagnosis not present

## 2020-12-03 ENCOUNTER — Other Ambulatory Visit: Payer: Self-pay

## 2020-12-03 ENCOUNTER — Emergency Department
Admission: EM | Admit: 2020-12-03 | Discharge: 2020-12-03 | Disposition: A | Discharge status: Home / Self Care | Payer: Medicare Other | Attending: Emergency Medicine | Admitting: Emergency Medicine

## 2020-12-03 DIAGNOSIS — W458XXA Other foreign body or object entering through skin, initial encounter: Secondary | ICD-10-CM | POA: Insufficient documentation

## 2020-12-03 DIAGNOSIS — I10 Essential (primary) hypertension: Secondary | ICD-10-CM | POA: Insufficient documentation

## 2020-12-03 DIAGNOSIS — S8991XA Unspecified injury of right lower leg, initial encounter: Secondary | ICD-10-CM | POA: Diagnosis present

## 2020-12-03 DIAGNOSIS — S80851A Superficial foreign body, right lower leg, initial encounter: Secondary | ICD-10-CM | POA: Diagnosis not present

## 2020-12-03 DIAGNOSIS — Z7982 Long term (current) use of aspirin: Secondary | ICD-10-CM | POA: Insufficient documentation

## 2020-12-03 DIAGNOSIS — Z87891 Personal history of nicotine dependence: Secondary | ICD-10-CM | POA: Insufficient documentation

## 2020-12-03 DIAGNOSIS — Z79899 Other long term (current) drug therapy: Secondary | ICD-10-CM | POA: Diagnosis not present

## 2020-12-03 MED ORDER — SULFAMETHOXAZOLE-TRIMETHOPRIM 800-160 MG PO TABS: 1.0000 | ORAL_TABLET | Freq: Two times a day (BID) | ORAL | 0 refills | Status: DC

## 2020-12-03 MED ORDER — LIDOCAINE HCL (PF) 1 % IJ SOLN
10.0000 mL | Freq: Once | INTRAMUSCULAR | Status: AC
  Administered 2020-12-03: 18:00:00 10 mL
  Filled 2020-12-03: qty 10

## 2020-12-03 NOTE — ED Notes (Signed)
Patient reports pulling a fishing line out of a tree, resulting in the hook becoming lodged into the back of his right calf. Patient is noted to have a multiple pronged fish hook in the skin behind his right knee. Patient reports UTD on tetanus (within 5 years). Patient reports daily 81mg  ASA use. Patient denies any pain, numbness, or tingling to the area. Patient ambulatory with steady gait.

## 2020-12-03 NOTE — ED Provider Notes (Signed)
[facility] Emergency Department Provider Note  ____________________________________________  Time seen: Approximately 5:33 PM  I have reviewed the triage vital signs and the nursing notes.   HISTORY  Chief Complaint Leg Injury    HPI Spencer Smith is a 65 y.o. male who presents the emergency department with a fishhook embedded in the right leg.  Patient was trying to break the line after the fishhook had been caught when it released and caught him in the back of the leg.  Patient presented with a tri-barb fishhook with 2 of the barbs embedded into the posterior left knee.  Full range of motion.  No active bleeding.  Lure is still attached to the hook at this time.       Past Medical History:  Diagnosis Date   Anxiety    History of kidney stones    Hypercholesterolemia    Hypertension    Sleep apnea     There are no problems to display for this patient.   Past Surgical History:  Procedure Laterality Date   COLONOSCOPY  2011   PROSTATE BIOPSY N/A 05/26/2020   Procedure: PROSTATE BIOPSY Vernelle Emerald;  Surgeon: Royston Cowper, MD;  Location: [facility] ORS;  Service: Urology;  Laterality: N/A;   TONSILLECTOMY  1963    Prior to Admission medications   Medication Sig Start Date End Date Taking? Authorizing Provider  sulfamethoxazole-trimethoprim (BACTRIM DS) 800-160 MG tablet Take 1 tablet by mouth 2 (two) times daily. 12/03/20  Yes Madilyn Cephas, Charline Bills, PA-C  amLODipine (NORVASC) 5 MG tablet Take 5 mg by mouth daily. 05/09/20   [provider]  aspirin EC 81 MG tablet Take 81 mg by mouth daily. Swallow whole.    [provider]  atorvastatin (LIPITOR) 20 MG tablet Take 20 mg by mouth daily. 05/11/20   [provider]  busPIRone (BUSPAR) 10 MG tablet Take 10 mg by mouth daily. 03/05/20   [provider]  cholecalciferol (VITAMIN D3) 25 MCG (1000 UNIT) tablet Take 1,000 Units by mouth daily.    [provider]   FLUoxetine (PROZAC) 40 MG capsule Take 40 mg by mouth daily. 05/02/20   [provider]  hydrochlorothiazide (HYDRODIURIL) 12.5 MG tablet Take 12.5 mg by mouth daily. 04/23/20   [provider]  levofloxacin (LEVAQUIN) 500 MG tablet Take 1 tablet (500 mg total) by mouth daily. 05/26/20   Royston Cowper, MD  Multiple Vitamin (MULTIVITAMIN WITH MINERALS) TABS tablet Take 1 tablet by mouth daily.    [provider]  Omega-3 Fatty Acids (FISH OIL) 1000 MG CAPS Take 1,000 mg by mouth daily.    [provider]    Allergies Patient has no known allergies.  Family History  Problem Relation Age of Onset   Heart disease Mother    Dementia Father     Social History Social History   Tobacco Use   Smoking status: Former    Pack years: 0.00    Types: Cigarettes    Quit date: 2019    Years since quitting: 3.4   Smokeless tobacco: Never  Vaping Use   Vaping Use: Never used  Substance Use Topics   Alcohol use: Not Currently    Comment: none since 2019   Drug use: Not Currently    Types: Marijuana    Comment: several years ago     Review of Systems  Constitutional: No fever/chills Eyes: No visual changes. No discharge ENT: No upper respiratory complaints. Cardiovascular: no chest pain.  Respiratory: no cough. No SOB. Gastrointestinal: No abdominal pain.  No nausea, no vomiting.  No diarrhea.  No constipation. Musculoskeletal: Embedded fishhook to the posterior right knee Skin: Negative for rash, abrasions, lacerations, ecchymosis. Neurological: Negative for headaches, focal weakness or numbness.  10 System ROS otherwise negative.  ____________________________________________   PHYSICAL EXAM:  VITAL SIGNS: ED Triage Vitals  Enc Vitals Group     BP 12/03/20 1354 132/76     Pulse Rate 12/03/20 1354 62     Resp 12/03/20 1354 20     Temp 12/03/20 1354 98.4 F (36.9 C)     Temp Source 12/03/20 1354 Oral     SpO2 12/03/20 1354 94 %      Weight 12/03/20 1355 215 lb (97.5 kg)     Height 12/03/20 1355 6\' 1"  (1.854 m)     Head Circumference --      Peak Flow --      Pain Score 12/03/20 1402 0     Pain Loc --      Pain Edu? --      Excl. in [place]? --      Constitutional: Alert and oriented. Well appearing and in no acute distress. Eyes: Conjunctivae are normal. PERRL. EOMI. Head: Atraumatic. ENT:      Ears:       Nose: No congestion/rhinnorhea.      Mouth/Throat: Mucous membranes are moist.  Neck: No stridor.    Cardiovascular: Normal rate, regular rhythm. Normal S1 and S2.  Good peripheral circulation. Respiratory: Normal respiratory effort without tachypnea or retractions. Lungs CTAB. Good air entry to the bases with no decreased or absent breath sounds. Musculoskeletal: Full range of motion to all extremities. No gross deformities appreciated.  Visualization of the posterior right knee reveals a fishing lure with a tribarb fishhook with 2 barbs embedded into the posterior right knee.  No active bleeding.  No other visible signs of trauma Neurologic:  Normal speech and language. No gross focal neurologic deficits are appreciated.  Skin:  Skin is warm, dry and intact. No rash noted. Psychiatric: Mood and affect are normal. Speech and behavior are normal. Patient exhibits appropriate insight and judgement.   ____________________________________________   LABS (all labs ordered are listed, but only abnormal results are displayed)  Labs Reviewed - No data to display ____________________________________________  EKG   ____________________________________________  RADIOLOGY  No results found.  ____________________________________________    PROCEDURES  Procedure(s) performed:    .Foreign Body Removal  Date/Time: 12/03/2020 6:18 PM Performed by: Darletta Moll, PA-C Authorized by: Darletta Moll, PA-C  Consent: Verbal consent obtained. Risks and benefits: risks, benefits and alternatives  were discussed Consent given by: patient Patient understanding: patient states understanding of the procedure being performed Patient identity confirmed: verbally with patient Time out: Immediately prior to procedure a "time out" was called to verify the correct patient, procedure, equipment, support staff and site/side marked as required. Body area: skin General location: lower extremity Location details: right knee Anesthesia: local infiltration  Anesthesia: Local Anesthetic: lidocaine 1% without epinephrine  Sedation: Patient sedated: no  Patient restrained: no Patient cooperative: yes Localization method: visualized Removal mechanism: forceps (Electric ring cutter) Dressing: dressing applied Tendon involvement: none Depth: subcutaneous Complexity: simple 2 objects recovered. Objects recovered: fishhook barbs Post-procedure assessment: foreign body removed Patient tolerance: patient tolerated the procedure well with no immediate complications Comments: Area was anesthetized locally with lidocaine.  After good anesthesia, the fishhook is grasped with needle drivers and stabilized.  The  lure is removed from the fishhook using the electric ring cutter.  Using constant pressure, the first barb is pushed through the skin exposing the bar.  This is clamped using another set of needle drivers.  Using the electric ring cutter, fishhook is cut through and then the nonbarbed portion is removed through the channel created.  Same technique for the second barb is undertaken.  Good success with full removal of foreign body.  Patient tolerated well no complications.  Area is thoroughly cleansed, dressed     Medications  lidocaine (PF) (XYLOCAINE) 1 % injection 10 mL (has no administration in time range)     ____________________________________________   INITIAL IMPRESSION / ASSESSMENT AND PLAN / ED COURSE  Pertinent labs & imaging results that were available during my care of the  patient were reviewed by me and considered in my medical decision making (see chart for details).  Review of the [place] CSRS was performed in accordance of the [place] prior to dispensing any controlled drugs.           Patient's diagnosis is consistent with foreign body in the skin of the right knee.  Patient had a fishhook became embedded in the posterior right knee.  This was successfully removed without any complications.  Wound care instructions discussed with the patient.  Patiently placed on antibiotics prophylactically.  He was up-to-date on his tetanus shot at this time.  Follow-up primary care as needed. Patient is given ED precautions to return to the ED for any worsening or new symptoms.     ____________________________________________  FINAL CLINICAL IMPRESSION(S) / ED DIAGNOSES  Final diagnoses:  Foreign body in right lower extremity, initial encounter      NEW MEDICATIONS STARTED DURING THIS VISIT:  ED Discharge Orders          Ordered    sulfamethoxazole-trimethoprim (BACTRIM DS) 800-160 MG tablet  2 times daily        12/03/20 1816                This chart was dictated using voice recognition software/Dragon. Despite best efforts to proofread, errors can occur which can change the meaning. Any change was purely unintentional.    Darletta Moll, PA-C 12/03/20 Derald Macleod, MD 12/03/20 [phone]

## 2020-12-03 NOTE — ED Triage Notes (Signed)
Pt to ER via POV, pt with fishing lure and two hooks stuck in posterior right calf. Fishing hook was caught on a tree, patient pulled down and hook got caught on his leg. No bleeding at this time. Swelling at insertion site.   Pt reports being up to date with tetanus shot.

## 2021-02-02 DIAGNOSIS — L719 Rosacea, unspecified: Secondary | ICD-10-CM | POA: Diagnosis not present

## 2021-02-02 DIAGNOSIS — I10 Essential (primary) hypertension: Secondary | ICD-10-CM | POA: Diagnosis not present

## 2021-02-02 DIAGNOSIS — E782 Mixed hyperlipidemia: Secondary | ICD-10-CM | POA: Diagnosis not present

## 2021-03-22 ENCOUNTER — Other Ambulatory Visit: Payer: Self-pay

## 2021-03-22 ENCOUNTER — Ambulatory Visit: Payer: Medicare Other | Admitting: Dermatology

## 2021-03-22 ENCOUNTER — Encounter: Payer: Self-pay | Admitting: Dermatology

## 2021-03-22 DIAGNOSIS — L918 Other hypertrophic disorders of the skin: Secondary | ICD-10-CM

## 2021-03-22 DIAGNOSIS — L719 Rosacea, unspecified: Secondary | ICD-10-CM | POA: Diagnosis not present

## 2021-03-22 DIAGNOSIS — L711 Rhinophyma: Secondary | ICD-10-CM

## 2021-03-22 MED ORDER — METRONIDAZOLE 1 % EX GEL: CUTANEOUS | 2 refills | Status: DC

## 2021-03-22 NOTE — Patient Instructions (Addendum)
Recommend daily broad spectrum sunscreen SPF 30+ to sun-exposed areas, reapply every 2 hours as needed. Call for new or changing lesions.  Staying in the shade or wearing long sleeves, sun glasses (UVA+UVB protection) and wide brim hats (4-inch brim around the entire circumference of the hat) are also recommended for sun protection.     Rosacea  What is rosacea? Rosacea (say: ro-zay-sha) is a common skin disease that usually begins as a trend of flushing or blushing easily.  As rosacea progresses, a persistent redness in the center of the face will develop and may gradually spread beyond the nose and cheeks to the forehead and chin.  In some cases, the ears, chest, and back could be affected.  Rosacea may appear as tiny blood vessels or small red bumps that occur in crops.  Frequently they can contain pus, and are called "pustules".  If the bumps do not contain pus, they are referred to as "papules".  Rarely, in prolonged, untreated cases of rosacea, the oil glands of the nose and cheeks may become permanently enlarged.  This is called rhinophyma, and is seen more frequently in men.  Signs and Risks In its beginning stages, rosacea tends to come and go, which makes it difficult to recognize.  It can start as intermittent flushing of the face.  Eventually, blood vessels may become permanently visible.  Pustules and papules can appear, but can be mistaken for adult acne.  People of all races, ages, genders and ethnic groups are at risk of developing rosacea.  However, it is more common in women (especially around menopause) and adults with fair skin between the ages of 37 and 52.  Treatment Dermatologists typically recommend a combination of treatments to effectively manage rosacea.  Treatment can improve symptoms and may stop the progression of the rosacea.  Treatment may involve both topical and oral medications.  The tetracycline antibiotics are often used for their anti-inflammatory effect; however,  because of the possibility of developing antibiotic resistance, they should not be used long term at full dose.  For dilated blood vessels the options include electrodessication (uses electric current through a small needle), laser treatment, and cosmetics to hide the redness.   With all forms of treatment, improvement is a slow process, and patients may not see any results for the first 3-4 weeks.  It is very important to avoid the sun and other triggers.  Patients must wear sunscreen daily.  Skin Care Instructions: Cleanse the skin with a mild soap such as CeraVe cleanser, Cetaphil cleanser, or Dove soap once or twice daily as needed. Moisturize with Eucerin Redness Relief Daily Perfecting Lotion (has a subtle green tint), CeraVe Moisturizing Cream, or Oil of Olay Daily Moisturizer with sunscreen every morning and/or night as recommended. Makeup should be "non-comedogenic" (won't clog pores) and be labeled "for sensitive skin". Good choices for cosmetics are: Neutrogena, Almay, and Physician's Formula.  Any product with a green tint tends to offset a red complexion. If your eyes are dry and irritated, use artificial tears 2-3 times per day and cleanse the eyelids daily with baby shampoo.  Have your eyes examined at least every 2 years.  Be sure to tell your eye doctor that you have rosacea. Alcoholic beverages tend to cause flushing of the skin, and may make rosacea worse. Always wear sunscreen, protect your skin from extreme hot and cold temperatures, and avoid spicy foods, hot drinks, and mechanical irritation such as rubbing, scrubbing, or massaging the face.  Avoid harsh skin  cleansers, cleansing masks, astringents, and exfoliation. If a particular product burns or makes your face feel tight, then it is likely to flare your rosacea. If you are having difficulty finding a sunscreen that you can tolerate, you may try switching to a chemical-free sunscreen.  These are ones whose active ingredient is  zinc oxide or titanium dioxide only.  They should also be fragrance free, non-comedogenic, and labeled for sensitive skin. Rosacea triggers may vary from person to person.  There are a variety of foods that have been reported to trigger rosacea.  Some patients find that keeping a diary of what they were doing when they flared helps them avoid triggers.  Wound Care Instructions  Cleanse wound gently with soap and water once a day then pat dry with clean gauze. Apply a thing coat of Petrolatum (petroleum jelly, "Vaseline") over the wound (unless you have an allergy to this). We recommend that you use a new, sterile tube of Vaseline. Do not pick or remove scabs. Do not remove the yellow or white "healing tissue" from the base of the wound.  Cover the wound with fresh, clean, nonstick gauze and secure with paper tape. You may use Band-Aids in place of gauze and tape if the would is small enough, but would recommend trimming much of the tape off as there is often too much. Sometimes Band-Aids can irritate the skin.  You should call the office for your biopsy report after 1 week if you have not already been contacted.  If you experience any problems, such as abnormal amounts of bleeding, swelling, significant bruising, significant pain, or evidence of infection, please call the office immediately.  FOR ADULT SURGERY PATIENTS: If you need something for pain relief you may take 1 extra strength Tylenol (acetaminophen) AND 2 Ibuprofen (200mg  each) together every 4 hours as needed for pain. (do not take these if you are allergic to them or if you have a reason you should not take them.) Typically, you may only need pain medication for 1 to 3 days.    If you have any questions or concerns for your doctor, please call our main line at [phone] and press option 4 to reach your doctor's medical assistant. If no one answers, please leave a voicemail as directed and we will return your call as soon as possible.  Messages left after 4 pm will be answered the following business day.   You may also send [place] a message via [place]. We typically respond to MyChart messages within 1-2 business days.  For prescription refills, please ask your pharmacy to contact our office. Our fax number is [phone].  If you have an urgent issue when the clinic is closed that cannot wait until the next business day, you can page your doctor at the number below.    Please note that while we do our best to be available for urgent issues outside of office hours, we are not available 24/7.   If you have an urgent issue and are unable to reach [place], you may choose to seek medical care at your doctor's office, retail clinic, urgent care center, or emergency room.  If you have a medical emergency, please immediately call 911 or go to the emergency department.  Pager Numbers  - Dr. Nehemiah Massed: [phone]  - Dr. Laurence Ferrari: [phone]  - Dr. Nicole Kindred: [phone]  In the event of inclement weather, please call our main line at [phone] for an update on the status of any delays or closures.  Dermatology Medication Tips: Please keep the boxes that topical medications come in in order to help keep track of the instructions about where and how to use these. Pharmacies typically print the medication instructions only on the boxes and not directly on the medication tubes.   If your medication is too expensive, please contact our office at [phone] option 4 or send [place] a message through [place].   We are unable to tell what your co-pay for medications will be in advance as this is different depending on your insurance coverage. However, we may be able to find a substitute medication at lower cost or fill out paperwork to get insurance to cover a needed medication.   If a prior authorization is required to get your medication covered by your insurance company, please allow [place] 1-2 business days to complete this process.  Drug  prices often vary depending on where the prescription is filled and some pharmacies may offer cheaper prices.  The website www.goodrx.com contains coupons for medications through different pharmacies. The prices here do not account for what the cost may be with help from insurance (it may be cheaper with your insurance), but the website can give you the price if you did not use any insurance.  - You can print the associated coupon and take it with your prescription to the pharmacy.  - You may also stop by our office during regular business hours and pick up a GoodRx coupon card.  - If you need your prescription sent electronically to a different pharmacy, notify our office through [facility] or by phone at [phone] option 4.

## 2021-03-22 NOTE — Progress Notes (Signed)
   New Patient Visit  Subjective  Spencer Smith is a 65 y.o. male who presents for the following: Skin Problem (Check redness on nose and spots on face. Dur: years. No previous treatment. ) and Skin Tag (Neck. Rubbed by clothing. ).    Objective  Well appearing patient in no apparent distress; mood and affect are within normal limits.  Review of Systems: No other skin or systemic complaints except as noted in HPI or Assessment and Plan.   A focused examination was performed including face, neck, chest and back. Relevant physical exam findings are noted in the Assessment and Plan.  Nose, cheeks, chin, forehead Erythema with sebaceous hypertrophy on nose. Cheeks with telangiectasias and erythema. Resolving pink papules at hair line.   Assessment & Plan  Rosacea Nose, cheeks, chin, forehead  With  mild/mod rhinophyma Rosacea is a chronic progressive skin condition usually affecting the face of adults, causing redness and/or acne bumps. It is treatable but not curable. It sometimes affects the eyes (ocular rosacea) as well. It may respond to topical and/or systemic medication and can flare with stress, sun exposure, alcohol, exercise and some foods.  Daily application of broad spectrum spf 30+ sunscreen to face is recommended to reduce flares.  Metrogel 1% QD to affected areas on face.   Discussed adding oral Doxycycline, patient prefers to start with topicals and will add later if not improving  metroNIDAZOLE (METROGEL) 1 % gel - Nose, cheeks, chin, forehead Apply QHS to affected areas on face.  Cutaneous skin tags  Skin tags vrs ISKs-  Removal desired by patient since irritated - waxy tan-colored pedunculated papules - Benign appearing.  - Prior to the procedure, reviewed the expected small wound. Also reviewed the risk of leaving a small scar and the small risk of infection.  PROCEDURE - The areas were prepped with isopropyl alcohol. A small amount of lidocaine 1% with  epinephrine was injected at the base of each lesion to achieve good local anesthesia. The skin tags were removed using a snip technique. Aluminum chloride was used for hemostasis. Petrolatum and a bandage were applied. The procedure was tolerated well. - Wound care was reviewed with the patient. They were advised to call with any concerns. Total number of treated lesions 9. (4 on right neck, 5 on left neck)  Return for rosacea recheck and TBSE in 4 months.   I, Emelia Salisbury, CMA, am acting as scribe for Brendolyn Patty, MD.  Documentation: I have reviewed the above documentation for accuracy and completeness, and I agree with the above.  Brendolyn Patty MD

## 2021-06-05 DIAGNOSIS — J302 Other seasonal allergic rhinitis: Secondary | ICD-10-CM | POA: Diagnosis not present

## 2021-06-05 DIAGNOSIS — G4733 Obstructive sleep apnea (adult) (pediatric): Secondary | ICD-10-CM | POA: Diagnosis not present

## 2021-06-05 DIAGNOSIS — E782 Mixed hyperlipidemia: Secondary | ICD-10-CM | POA: Diagnosis not present

## 2021-06-05 DIAGNOSIS — I10 Essential (primary) hypertension: Secondary | ICD-10-CM | POA: Diagnosis not present

## 2021-06-08 DIAGNOSIS — I10 Essential (primary) hypertension: Secondary | ICD-10-CM | POA: Diagnosis not present

## 2021-06-08 DIAGNOSIS — J302 Other seasonal allergic rhinitis: Secondary | ICD-10-CM | POA: Diagnosis not present

## 2021-06-08 DIAGNOSIS — E782 Mixed hyperlipidemia: Secondary | ICD-10-CM | POA: Diagnosis not present

## 2021-08-01 ENCOUNTER — Other Ambulatory Visit: Payer: Self-pay

## 2021-08-01 ENCOUNTER — Ambulatory Visit: Payer: Medicare Other | Admitting: Dermatology

## 2021-08-01 DIAGNOSIS — L821 Other seborrheic keratosis: Secondary | ICD-10-CM | POA: Diagnosis not present

## 2021-08-01 DIAGNOSIS — L814 Other melanin hyperpigmentation: Secondary | ICD-10-CM

## 2021-08-01 DIAGNOSIS — Z1283 Encounter for screening for malignant neoplasm of skin: Secondary | ICD-10-CM | POA: Diagnosis not present

## 2021-08-01 DIAGNOSIS — L578 Other skin changes due to chronic exposure to nonionizing radiation: Secondary | ICD-10-CM

## 2021-08-01 DIAGNOSIS — D229 Melanocytic nevi, unspecified: Secondary | ICD-10-CM

## 2021-08-01 DIAGNOSIS — L820 Inflamed seborrheic keratosis: Secondary | ICD-10-CM

## 2021-08-01 DIAGNOSIS — L719 Rosacea, unspecified: Secondary | ICD-10-CM | POA: Diagnosis not present

## 2021-08-01 DIAGNOSIS — D1800 Hemangioma unspecified site: Secondary | ICD-10-CM

## 2021-08-01 MED ORDER — METRONIDAZOLE 1 % EX GEL: CUTANEOUS | 10 refills | Status: DC

## 2021-08-01 NOTE — Patient Instructions (Addendum)
Seborrheic Keratosis  What causes seborrheic keratoses? Seborrheic keratoses are harmless, common skin growths that first appear during adult life.  As time goes by, more growths appear.  Some people may develop a large number of them.  Seborrheic keratoses appear on both covered and uncovered body parts.  They are not caused by sunlight.  The tendency to develop seborrheic keratoses can be inherited.  They vary in color from skin-colored to gray, brown, or even black.  They can be either smooth or have a rough, warty surface.   Seborrheic keratoses are superficial and look as if they were stuck on the skin.  Under the microscope this type of keratosis looks like layers upon layers of skin.  That is why at times the top layer may seem to fall off, but the rest of the growth remains and re-grows.    Treatment Seborrheic keratoses do not need to be treated, but can easily be removed in the office.  Seborrheic keratoses often cause symptoms when they rub on clothing or jewelry.  Lesions can be in the way of shaving.  If they become inflamed, they can cause itching, soreness, or burning.  Removal of a seborrheic keratosis can be accomplished by freezing, burning, or surgery. If any spot bleeds, scabs, or grows rapidly, please return to have it checked, as these can be an indication of a skin cancer.  Cryotherapy Aftercare  Wash gently with soap and water everyday.   Apply Vaseline and Band-Aid daily until healed.     Melanoma ABCDEs  Melanoma is the most dangerous type of skin cancer, and is the leading cause of death from skin disease.  You are more likely to develop melanoma if you: Have light-colored skin, light-colored eyes, or red or blond hair Spend a lot of time in the sun Tan regularly, either outdoors or in a tanning bed Have had blistering sunburns, especially during childhood Have a close family member who has had a melanoma Have atypical moles or large birthmarks  Early detection  of melanoma is key since treatment is typically straightforward and cure rates are extremely high if we catch it early.   The first sign of melanoma is often a change in a mole or a new dark spot.  The ABCDE system is a way of remembering the signs of melanoma.  A for asymmetry:  The two halves do not match. B for border:  The edges of the growth are irregular. C for color:  A mixture of colors are present instead of an even brown color. D for diameter:  Melanomas are usually (but not always) greater than 65mm - the size of a pencil eraser. E for evolution:  The spot keeps changing in size, shape, and color.  Please check your skin once per month between visits. You can use a small mirror in front and a large mirror behind you to keep an eye on the back side or your body.   If you see any new or changing lesions before your next follow-up, please call to schedule a visit.  Please continue daily skin protection including broad spectrum sunscreen SPF 30+ to sun-exposed areas, reapplying every 2 hours as needed when you're outdoors.   Staying in the shade or wearing long sleeves, sun glasses (UVA+UVB protection) and wide brim hats (4-inch brim around the entire circumference of the hat) are also recommended for sun protection.    If You Need Anything After Your Visit  If you have any questions or concerns  for your doctor, please call our main line at [phone] and press option 4 to reach your doctor's medical assistant. If no one answers, please leave a voicemail as directed and we will return your call as soon as possible. Messages left after 4 pm will be answered the following business day.   You may also send [place] a message via [place]. We typically respond to MyChart messages within 1-2 business days.  For prescription refills, please ask your pharmacy to contact our office. Our fax number is [phone].  If you have an urgent issue when the clinic is closed that cannot wait until the  next business day, you can page your doctor at the number below.    Please note that while we do our best to be available for urgent issues outside of office hours, we are not available 24/7.   If you have an urgent issue and are unable to reach [place], you may choose to seek medical care at your doctor's office, retail clinic, urgent care center, or emergency room.  If you have a medical emergency, please immediately call 911 or go to the emergency department.  Pager Numbers  - Dr. Nehemiah Massed: [phone]  - Dr. Laurence Ferrari: [phone]  - Dr. Nicole Kindred: [phone]  In the event of inclement weather, please call our main line at [phone] for an update on the status of any delays or closures.  Dermatology Medication Tips: Please keep the boxes that topical medications come in in order to help keep track of the instructions about where and how to use these. Pharmacies typically print the medication instructions only on the boxes and not directly on the medication tubes.   If your medication is too expensive, please contact our office at [phone] option 4 or send [place] a message through [place].   We are unable to tell what your co-pay for medications will be in advance as this is different depending on your insurance coverage. However, we may be able to find a substitute medication at lower cost or fill out paperwork to get insurance to cover a needed medication.   If a prior authorization is required to get your medication covered by your insurance company, please allow [place] 1-2 business days to complete this process.  Drug prices often vary depending on where the prescription is filled and some pharmacies may offer cheaper prices.  The website www.goodrx.com contains coupons for medications through different pharmacies. The prices here do not account for what the cost may be with help from insurance (it may be cheaper with your insurance), but the website can give you the price if you did not  use any insurance.  - You can print the associated coupon and take it with your prescription to the pharmacy.  - You may also stop by our office during regular business hours and pick up a GoodRx coupon card.  - If you need your prescription sent electronically to a different pharmacy, notify our office through [facility] or by phone at [phone] option 4.     Si Usted Necesita Algo Despus de Su Visita  Tambin puede enviarnos un mensaje a travs de Pharmacist, community. Por lo general respondemos a los mensajes de MyChart en el transcurso de 1 a 2 das hbiles.  Para renovar recetas, por favor pida a su farmacia que se ponga en contacto con nuestra oficina. Harland Dingwall de fax es [place] [phone].  Si tiene un asunto urgente cuando la clnica est cerrada y que no puede esperar Nurse, children's  el siguiente da hbil, puede llamar/localizar a su doctor(a) al nmero que aparece a continuacin.   Por favor, tenga en cuenta que aunque hacemos todo lo posible para estar disponibles para asuntos urgentes fuera del horario de [place], no estamos disponibles las 24 horas del da, los 7 das de la [place].   Si tiene un problema urgente y no puede comunicarse con nosotros, puede optar por buscar atencin mdica  en el consultorio de su doctor(a), en una clnica privada, en un centro de atencin urgente o en una sala de emergencias.  Si tiene Engineering geologist, por favor llame inmediatamente al 911 o vaya a la sala de emergencias.  Nmeros de bper  - Dr. Nehemiah Massed: [phone]  - Dra. Moye: [phone]  - Dra. Nicole Kindred: [phone]  En caso de inclemencias del [place], por favor llame a Johnsie Kindred principal al [phone] para una actualizacin sobre el [place] de cualquier retraso o cierre.  Consejos para la medicacin en dermatologa: Por favor, guarde las cajas en las que vienen los medicamentos de uso tpico para ayudarle a seguir las instrucciones sobre dnde y cmo usarlos. Las farmacias  generalmente imprimen las instrucciones del medicamento slo en las cajas y no directamente en los tubos del [place].   Si su medicamento es muy caro, por favor, pngase en contacto con Zigmund Daniel llamando al [phone] y presione la opcin 4 o envenos un mensaje a travs de Pharmacist, community.   No podemos decirle cul ser su copago por los medicamentos por adelantado ya que esto es diferente dependiendo de la cobertura de su seguro. Sin embargo, es posible que podamos encontrar un medicamento sustituto a Electrical engineer un formulario para que el seguro cubra el medicamento que se considera necesario.   Si se requiere una autorizacin previa para que su compaa de seguros [place] su medicamento, por favor permtanos de 1 a 2 das hbiles para completar este proceso.  Los precios de los medicamentos varan con frecuencia dependiendo del Environmental consultant de dnde se surte la receta y alguna farmacias pueden ofrecer precios ms baratos.  El sitio web www.goodrx.com tiene cupones para medicamentos de Airline pilot. Los precios aqu no tienen en cuenta lo que podra costar con la ayuda del seguro (puede ser ms barato con su seguro), pero el sitio web puede darle el precio si no utiliz Research scientist (physical sciences).  - Puede imprimir el cupn correspondiente y llevarlo con su receta a la farmacia.  - Tambin puede pasar por nuestra oficina durante el horario de atencin regular y Charity fundraiser una tarjeta de cupones de GoodRx.  - Si necesita que su receta se enve electrnicamente a una farmacia diferente, informe a nuestra oficina a travs de MyChart de [place] o por telfono llamando al [phone] y presione la opcin 4.

## 2021-08-01 NOTE — Progress Notes (Signed)
Follow-Up Visit   Subjective  Spencer Smith is a 66 y.o. male who presents for the following: Follow-up (Patient here today for 4 month rosacea follow up and tbse. Patient reports metrogel is helping and is happy with results. ).  The patient presents for Total-Body Skin Exam (TBSE) for skin cancer screening and mole check.  The patient has spots, moles and lesions to be evaluated, some may be new or changing and the patient has concerns that these could be cancer.  Occipatal scalp  The following portions of the chart were reviewed this encounter and updated as appropriate:      Review of Systems: No other skin or systemic complaints except as noted in HPI or Assessment and Plan.   Objective  Well appearing patient in no apparent distress; mood and affect are within normal limits.  A full examination was performed including scalp, head, eyes, ears, nose, lips, neck, chest, axillae, abdomen, back, buttocks, bilateral upper extremities, bilateral lower extremities, hands, feet, fingers, toes, fingernails, and toenails. All findings within normal limits unless otherwise noted below.  Head - Anterior (Face) Erythema on cheeks and nose with telangectasia   right mid back x1, left neck x 1, left postauricular scalp x 1 (3) Erythematous stuck-on, waxy dark brown papules  Pink/tan waxy patch R mid back  Right lower eyelid - small waxy flesh papule     Assessment & Plan  Rosacea Head - Anterior (Face)  Chronic condition with duration or expected duration over one year. Currently well-controlled.   Rosacea is a chronic progressive skin condition usually affecting the face of adults, causing redness and/or acne bumps. It is treatable but not curable. It sometimes affects the eyes (ocular rosacea) as well. It may respond to topical and/or systemic medication and can flare with stress, sun exposure, alcohol, exercise and some foods.  Daily application of broad spectrum spf 30+  sunscreen to face is recommended to reduce flares.  Continue metrogel 1% gel qd  Related Medications metroNIDAZOLE (METROGEL) 1 % gel Apply QHS to affected areas on face.  Inflamed seborrheic keratosis (3) right mid back x1, left neck x 1, left postauricular scalp x 1  Irritated   Recheck right back at next follow up   Right lower eyelid - no treatment since so close to eye, observe for now.  Consider snip excision if it grows larger.  Destruction of lesion - right mid back x1, left neck x 1, left postauricular scalp x 1  Destruction method: cryotherapy   Informed consent: discussed and consent obtained   Lesion destroyed using liquid nitrogen: Yes   Region frozen until ice ball extended beyond lesion: Yes   Outcome: patient tolerated procedure well with no complications   Post-procedure details: wound care instructions given   Additional details:  Prior to procedure, discussed risks of blister formation, small wound, skin dyspigmentation, or rare scar following cryotherapy. Recommend Vaseline ointment to treated areas while healing.   Lentigines - Scattered tan macules - Due to sun exposure - Benign-appearing, observe - Recommend daily broad spectrum sunscreen SPF 30+ to sun-exposed areas, reapply every 2 hours as needed. - Call for any changes  Seborrheic Keratoses - Stuck-on, waxy, tan-brown papules and/or plaques  - Benign-appearing - Discussed benign etiology and prognosis. - Observe - Call for any changes  Melanocytic Nevi - Tan-brown and/or pink-flesh-colored symmetric macules and papules - Benign appearing on exam today - Observation - Call clinic for new or changing moles - Recommend daily use of  broad spectrum spf 30+ sunscreen to sun-exposed areas.   Hemangiomas - Red papules - Discussed benign nature - Observe - Call for any changes  Actinic Damage - Chronic condition, secondary to cumulative UV/sun exposure - diffuse scaly erythematous macules  with underlying dyspigmentation - Recommend daily broad spectrum sunscreen SPF 30+ to sun-exposed areas, reapply every 2 hours as needed.  - Staying in the shade or wearing long sleeves, sun glasses (UVA+UVB protection) and wide brim hats (4-inch brim around the entire circumference of the hat) are also recommended for sun protection.  - Call for new or changing lesions.  Skin cancer screening performed today. Return for 1 year tbse / h.o rosacea . I, Ruthell Rummage, CMA, am acting as scribe for Brendolyn Patty, MD.  Documentation: I have reviewed the above documentation for accuracy and completeness, and I agree with the above.  Brendolyn Patty MD

## 2021-10-03 DIAGNOSIS — E782 Mixed hyperlipidemia: Secondary | ICD-10-CM | POA: Diagnosis not present

## 2021-10-03 DIAGNOSIS — G4733 Obstructive sleep apnea (adult) (pediatric): Secondary | ICD-10-CM | POA: Diagnosis not present

## 2021-10-03 DIAGNOSIS — I10 Essential (primary) hypertension: Secondary | ICD-10-CM | POA: Diagnosis not present

## 2021-10-04 DIAGNOSIS — I10 Essential (primary) hypertension: Secondary | ICD-10-CM | POA: Diagnosis not present

## 2021-10-04 DIAGNOSIS — E782 Mixed hyperlipidemia: Secondary | ICD-10-CM | POA: Diagnosis not present

## 2021-11-08 ENCOUNTER — Other Ambulatory Visit: Payer: Self-pay

## 2021-11-08 ENCOUNTER — Ambulatory Visit
Admission: EM | Admit: 2021-11-08 | Discharge: 2021-11-08 | Disposition: A | Discharge status: Home / Self Care | Payer: Medicare Other | Attending: Physician Assistant | Admitting: Physician Assistant

## 2021-11-08 ENCOUNTER — Ambulatory Visit (INDEPENDENT_AMBULATORY_CARE_PROVIDER_SITE_OTHER): Payer: Medicare Other

## 2021-11-08 DIAGNOSIS — M25572 Pain in left ankle and joints of left foot: Secondary | ICD-10-CM

## 2021-11-08 DIAGNOSIS — M25532 Pain in left wrist: Secondary | ICD-10-CM | POA: Diagnosis not present

## 2021-11-08 DIAGNOSIS — M19032 Primary osteoarthritis, left wrist: Secondary | ICD-10-CM | POA: Diagnosis not present

## 2021-11-08 DIAGNOSIS — M1812 Unilateral primary osteoarthritis of first carpometacarpal joint, left hand: Secondary | ICD-10-CM | POA: Diagnosis not present

## 2021-11-08 MED ORDER — NAPROXEN 500 MG PO TABS: 500.0000 mg | ORAL_TABLET | Freq: Two times a day (BID) | ORAL | 0 refills | Status: DC

## 2021-11-08 NOTE — ED Provider Notes (Signed)
?[facility] ? ? ? ?CSN: [id number] ?Arrival date & time: 11/08/21  0901 ? ? ?  ? ?History   ?Chief Complaint ?Chief Complaint  ?Patient presents with  ? Wrist Pain  ? ? ?HPI ?Spencer Smith is a 66 y.o. male.  ? ?Patient presents with pain and swelling to the left wrist after fall 1 day ago.  Endorses that he fell into a wooden.  Yesterday catching himself with his arms and hands.  Abrasions present to the right arm and knee.  Range of motion intact of the left wrist but pain is elicited.  Has attempted use of over-the-counter Tylenol and ibuprofen which has been minimally helpful.  Denies numbness, tingling, prior injury. ? ?Past Medical History:  ?Diagnosis Date  ? Anxiety   ? History of kidney stones   ? Hypercholesterolemia   ? Hypertension   ? Sleep apnea   ? ? ?There are no problems to display for this patient. ? ? ?Past Surgical History:  ?Procedure Laterality Date  ? COLONOSCOPY  2011  ? PROSTATE BIOPSY N/A 05/26/2020  ? Procedure: PROSTATE BIOPSY URONAV;  Surgeon: Royston Cowper, MD;  Location: [facility] ORS;  Service: Urology;  Laterality: N/A;  ? TONSILLECTOMY  1963  ? ? ? ? ? ?Home Medications   ? ?Prior to Admission medications   ?Medication Sig Start Date End Date Taking? Authorizing Provider  ?amLODipine (NORVASC) 5 MG tablet Take 5 mg by mouth daily. 05/09/20   [provider]  ?aspirin EC 81 MG tablet Take 81 mg by mouth daily. Swallow whole.    [provider]  ?atorvastatin (LIPITOR) 20 MG tablet Take 20 mg by mouth daily. 05/11/20   [provider]  ?busPIRone (BUSPAR) 10 MG tablet Take 10 mg by mouth daily. 03/05/20   [provider]  ?cholecalciferol (VITAMIN D3) 25 MCG (1000 UNIT) tablet Take 1,000 Units by mouth daily.    [provider]  ?FLUoxetine (PROZAC) 40 MG capsule Take 40 mg by mouth daily. 05/02/20   [provider]  ?hydrochlorothiazide (HYDRODIURIL) 12.5 MG tablet Take 12.5 mg by mouth daily. 04/23/20   [provider]  ?levofloxacin (LEVAQUIN) 500 MG tablet Take 1 tablet (500 mg total) by mouth daily. 05/26/20   Royston Cowper, MD  ?metroNIDAZOLE (METROGEL) 1 % gel Apply QHS to affected areas on face. 08/01/21   Brendolyn Patty, MD  ?Multiple Vitamin (MULTIVITAMIN WITH MINERALS) TABS tablet Take 1 tablet by mouth daily.    [provider]  ?Omega-3 Fatty Acids (FISH OIL) 1000 MG CAPS Take 1,000 mg by mouth daily.    [provider]  ?sulfamethoxazole-trimethoprim (BACTRIM DS) 800-160 MG tablet Take 1 tablet by mouth 2 (two) times daily. 12/03/20   Cuthriell, Charline Bills, PA-C  ? ? ?Family History ?Family History  ?Problem Relation Age of Onset  ? Heart disease Mother   ? Dementia Father   ? ? ?Social History ?Social History  ? ?Tobacco Use  ? Smoking status: Former  ?  Types: Cigarettes  ?  Quit date: 2019  ?  Years since quitting: 4.3  ? Smokeless tobacco: Never  ?Vaping Use  ? Vaping Use: Never used  ?Substance Use Topics  ? Alcohol use: Not Currently  ?  Comment: none since 2019  ? Drug use: Not Currently  ?  Types: Marijuana  ?  Comment: several years ago  ? ? ? ?Allergies   ?Patient has no known allergies. ? ? ?Review of Systems ?  Review of Systems  ?Constitutional: Negative.   ?Respiratory: Negative.    ?Cardiovascular: Negative.   ?Musculoskeletal:  Positive for joint swelling. Negative for arthralgias, back pain, gait problem, myalgias, neck pain and neck stiffness.  ?Skin: Negative.   ? ? ?Physical Exam ?Triage Vital Signs ?ED Triage Vitals  ?Enc Vitals Group  ?   BP 11/08/21 0911 126/74  ?   Pulse Rate 11/08/21 0911 (!) 56  ?   Resp 11/08/21 0911 17  ?   Temp 11/08/21 0911 98.1 ?F (36.7 ?C)  ?   Temp src --   ?   SpO2 11/08/21 0911 96 %  ?   Weight 11/08/21 0912 218 lb (98.9 kg)  ?   Height 11/08/21 0912 6' (1.829 m)  ?   Head Circumference --   ?   Peak Flow --   ?   Pain Score 11/08/21 0909 6  ?   Pain Loc --   ?   Pain Edu? --   ?   Excl. in [place]? --   ? ?No data found. ? ?Updated Vital  Signs ?BP 126/74 (BP Location: Right Arm)   Pulse (!) 56   Temp 98.1 ?F (36.7 ?C)   Resp 17   Ht 6' (1.829 m)   Wt 218 lb (98.9 kg)   SpO2 96%   BMI 29.57 kg/m?  ? ?Visual Acuity ?Right Eye Distance:   ?Left Eye Distance:   ?Bilateral Distance:   ? ?Right Eye Near:   ?Left Eye Near:    ?Bilateral Near:    ? ?Physical Exam ?Constitutional:   ?   Appearance: Normal appearance.  ?HENT:  ?   Head: Normocephalic.  ?Eyes:  ?   Extraocular Movements: Extraocular movements intact.  ?Pulmonary:  ?   Effort: Pulmonary effort is normal.  ?Musculoskeletal:  ?   Comments: Mild to moderate swelling over the left scaphoid bone and base of the left thumb, tenderness over the volar aspect of the left wrist without swelling, sensation intact, range of motion intact, 2+ radial pulse  ?Skin: ?   Comments: 1 x 2 cm abrasion noted to the anterior of the right knee ? ?3 to 4 inch abrasion noted to the posterior of the right forearm  ?Neurological:  ?   Mental Status: He is alert and oriented to person, place, and time. Mental status is at baseline.  ?Psychiatric:     ?   Mood and Affect: Mood normal.     ?   Behavior: Behavior normal.  ? ? ? ?UC Treatments / Results  ?Labs ?(all labs ordered are listed, but only abnormal results are displayed) ?Labs Reviewed - No data to display ? ?EKG ? ? ?Radiology ?No results found. ? ?Procedures ?Procedures (including critical care time) ? ?Medications Ordered in UC ?Medications - No data to display ? ?Initial Impression / Assessment and Plan / UC Course  ?I have reviewed the triage vital signs and the nursing notes. ? ?Pertinent labs & imaging results that were available during my care of the patient were reviewed by me and considered in my medical decision making (see chart for details). ? ?Clinical Course as of 11/08/21 0956  ?Wed Nov 08, 2021  ?0945 DG Wrist Complete Left [AW]  ?  ?Clinical Course User Index ?[AW] Hans Eden, NP  ? ? ?Acute pain of left wrist ? ?Wrist x-ray negative  for acute findings, showing worsening degenerative changes, discussed with patient, prescribed naproxen 500 mg twice daily for  5 days then as needed to help reduce swelling, recommended heat or ice over the affected area in 10 to 15-minute intervals, patient endorses his wife purchased a wrist brace, may use as needed for stability, abrasions have already begun to heal appropriately, discussed with patient to watchfully wait as they should resolve with time, given strict precautions for signs of infection and to return to the urgent care as needed for reevaluation ,given precautions that if symptoms persist past 2 weeks to follow-up with orthopedics, walking referral given ?Final Clinical Impressions(s) / UC Diagnoses  ? ?Final diagnoses:  ?Pain of joint of left ankle and foot  ? ?Discharge Instructions   ?None ?  ? ?ED Prescriptions   ?None ?  ? ?PDMP not reviewed this encounter. ?  ?Hans Eden, NP ?11/08/21 1000 ? ?

## 2021-11-08 NOTE — ED Triage Notes (Signed)
Pt reports he fell onto a wooden pier yesterday. Abrasions to right forearm and pain in left inner wrist. Mild swelling.  ?

## 2021-11-08 NOTE — Discharge Instructions (Addendum)
Wrist x-rays negative for injury to the bone, however there are able to notate some degenerative changes which are not new to the joint in your thumb  ? ?Your symptoms should improve with time ? ?Take naproxen twice daily for 5 days then as needed, this medicine is to help reduce the swelling which in turn should help reduce your pain ? ?You may use ice or heat over the affected area in 10 to 15-minute intervals ? ?You may wrap your wrist and hand with compression or use wrist brace as needed for additional comfort and stability ? ?If your symptoms persist past 2 weeks it is recommended that you follow-up with a orthopedic specialist, information is listed ?

## 2022-02-02 DIAGNOSIS — J3089 Other allergic rhinitis: Secondary | ICD-10-CM | POA: Diagnosis not present

## 2022-02-02 DIAGNOSIS — G4733 Obstructive sleep apnea (adult) (pediatric): Secondary | ICD-10-CM | POA: Diagnosis not present

## 2022-02-02 DIAGNOSIS — I10 Essential (primary) hypertension: Secondary | ICD-10-CM | POA: Diagnosis not present

## 2022-02-02 DIAGNOSIS — E782 Mixed hyperlipidemia: Secondary | ICD-10-CM | POA: Diagnosis not present

## 2022-02-02 DIAGNOSIS — F32A Depression, unspecified: Secondary | ICD-10-CM | POA: Diagnosis not present

## 2022-05-30 DIAGNOSIS — I10 Essential (primary) hypertension: Secondary | ICD-10-CM | POA: Diagnosis not present

## 2022-05-30 DIAGNOSIS — H2513 Age-related nuclear cataract, bilateral: Secondary | ICD-10-CM | POA: Diagnosis not present

## 2022-07-19 IMAGING — MR MR PROSTATE WO/W CM
56 series · 56 of 56 positions shown · IV contrast (10ml Gadavist)
Comparison: CT pelvis 11/07/2004

CLINICAL DATA: Elevated PSA level and enlarged prostate.

EXAM:
MR PROSTATE WITHOUT AND WITH CONTRAST
TECHNIQUE: Multiplanar multisequence MRI images were obtained of the pelvis
centered about the prostate. Pre and post contrast images were
obtained.
CONTRAST:  10mL GADAVIST GADOBUTROL 1 MMOL/ML IV SOLN

[Series 3: ax in&out whole · axial · 5.0mm · 0.74mm/px · 1 of 70 slices shown]
[im 1/70]
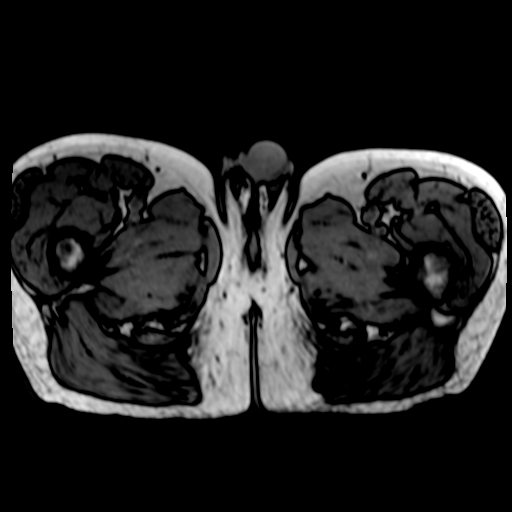

[Series 4: T2 · axial · 3.0mm · 0.56mm/px · 1 of 25 slices shown (1 of 3)]
[im 1/25]
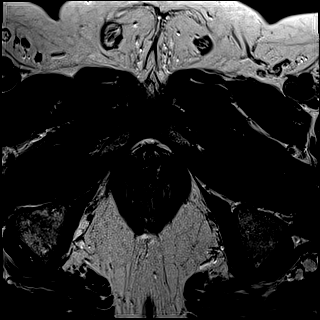

[Series 5: T2 · coronal · 3.0mm · 0.70mm/px · 1 of 35 slices shown (2 of 3)]
[im 1/35]
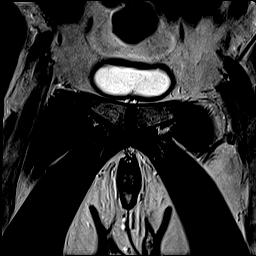

[Series 6: DWI · axial · 3.0mm · 0.86mm/px · 1 of 75 slices shown (1 of 3)]
[im 1/75]
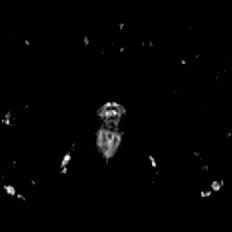

[Series 7: DWI · axial · 3.0mm · 0.86mm/px · 1 of 25 slices shown (2 of 3)]
[im 1/25]
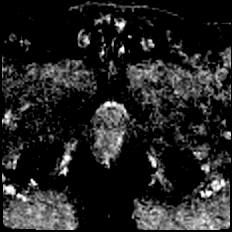

[Series 8: DWI · axial · 3.0mm · 0.86mm/px · 1 of 25 slices shown (3 of 3)]
[im 1/25]
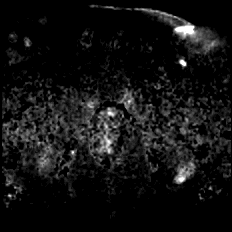

[Series 9: T2 · axial · 1.0mm · 1.04mm/px · 1 of 72 slices shown (3 of 3)]
[im 1/72]
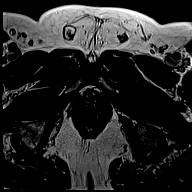

[Series 10: T1 · axial · 3.0mm · 1.15mm/px · 1 of 28 slices shown (1 of 49)]
[im 1/28]
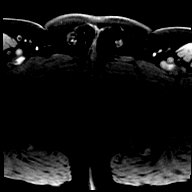

[Series 11: T1 · axial · 3.0mm · 1.15mm/px · 1 of 28 slices shown (2 of 49)]
[im 1/28]
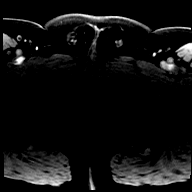

[Series 12: T1 · axial · 3.0mm · 1.15mm/px · 1 of 28 slices shown (3 of 49)]
[im 1/28]
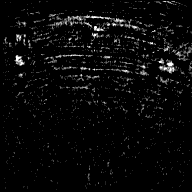

[Series 13: T1 · axial · 3.0mm · 1.15mm/px · 1 of 28 slices shown (4 of 49)]
[im 1/28]
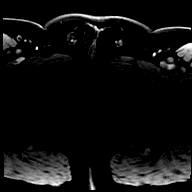

[Series 14: T1 · axial · 3.0mm · 1.15mm/px · 1 of 28 slices shown (5 of 49)]
[im 1/28]
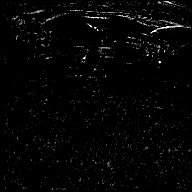

[Series 15: T1 · axial · 3.0mm · 1.15mm/px · 1 of 28 slices shown (6 of 49)]
[im 1/28]
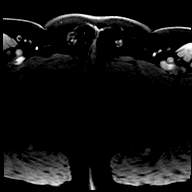

[Series 16: T1 · axial · 3.0mm · 1.15mm/px · 1 of 28 slices shown (7 of 49)]
[im 1/28]
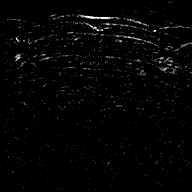

[Series 17: T1 · axial · 3.0mm · 1.15mm/px · 1 of 28 slices shown (8 of 49)]
[im 1/28]
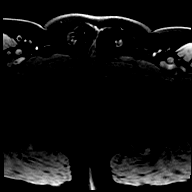

[Series 18: T1 · axial · 3.0mm · 1.15mm/px · 1 of 28 slices shown (9 of 49)]
[im 1/28]
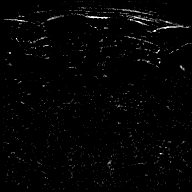

[Series 19: T1 · axial · 3.0mm · 1.15mm/px · 1 of 28 slices shown (10 of 49)]
[im 1/28]
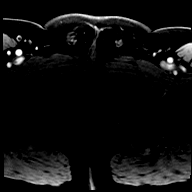

[Series 20: T1 · axial · 3.0mm · 1.15mm/px · 1 of 28 slices shown (11 of 49)]
[im 1/28]
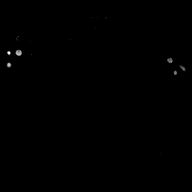

[Series 21: T1 · axial · 3.0mm · 1.15mm/px · 1 of 28 slices shown (12 of 49)]
[im 1/28]
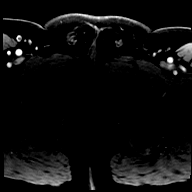

[Series 22: T1 · axial · 3.0mm · 1.15mm/px · 1 of 28 slices shown (13 of 49)]
[im 1/28]
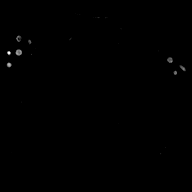

[Series 23: T1 · axial · 3.0mm · 1.15mm/px · 1 of 28 slices shown (14 of 49)]
[im 1/28]
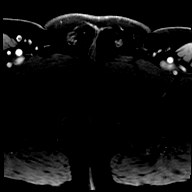

[Series 24: T1 · axial · 3.0mm · 1.15mm/px · 1 of 28 slices shown (15 of 49)]
[im 1/28]
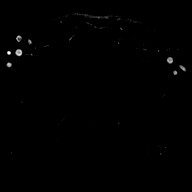

[Series 25: T1 · axial · 3.0mm · 1.15mm/px · 1 of 28 slices shown (16 of 49)]
[im 1/28]
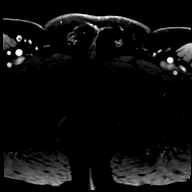

[Series 26: T1 · axial · 3.0mm · 1.15mm/px · 1 of 28 slices shown (17 of 49)]
[im 1/28]
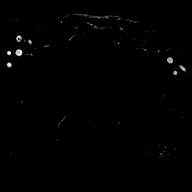

[Series 27: T1 · axial · 3.0mm · 1.15mm/px · 1 of 28 slices shown (18 of 49)]
[im 1/28]
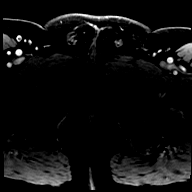

[Series 28: T1 · axial · 3.0mm · 1.15mm/px · 1 of 28 slices shown (19 of 49)]
[im 1/28]
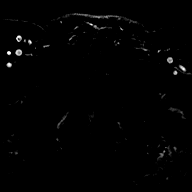

[Series 29: T1 · axial · 3.0mm · 1.15mm/px · 1 of 28 slices shown (20 of 49)]
[im 1/28]
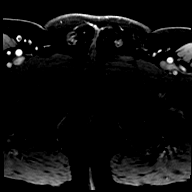

[Series 30: T1 · axial · 3.0mm · 1.15mm/px · 1 of 28 slices shown (21 of 49)]
[im 1/28]
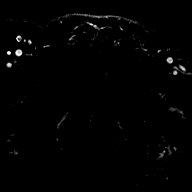

[Series 31: T1 · axial · 3.0mm · 1.15mm/px · 1 of 28 slices shown (22 of 49)]
[im 1/28]
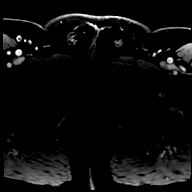

[Series 32: T1 · axial · 3.0mm · 1.15mm/px · 1 of 28 slices shown (23 of 49)]
[im 1/28]
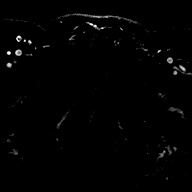

[Series 33: T1 · axial · 3.0mm · 1.15mm/px · 1 of 28 slices shown (24 of 49)]
[im 1/28]
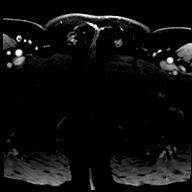

[Series 34: T1 · axial · 3.0mm · 1.15mm/px · 1 of 28 slices shown (25 of 49)]
[im 1/28]
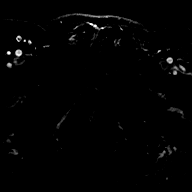

[Series 35: T1 · axial · 3.0mm · 1.15mm/px · 1 of 28 slices shown (26 of 49)]
[im 1/28]
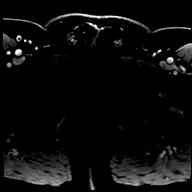

[Series 36: T1 · axial · 3.0mm · 1.15mm/px · 1 of 28 slices shown (27 of 49)]
[im 1/28]
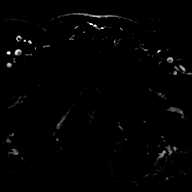

[Series 37: T1 · axial · 3.0mm · 1.15mm/px · 1 of 28 slices shown (28 of 49)]
[im 1/28]
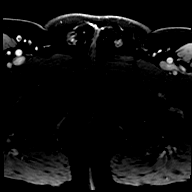

[Series 38: T1 · axial · 3.0mm · 1.15mm/px · 1 of 28 slices shown (29 of 49)]
[im 1/28]
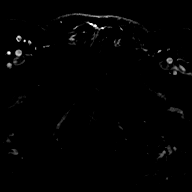

[Series 39: T1 · axial · 3.0mm · 1.15mm/px · 1 of 28 slices shown (30 of 49)]
[im 1/28]
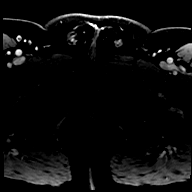

[Series 40: T1 · axial · 3.0mm · 1.15mm/px · 1 of 28 slices shown (31 of 49)]
[im 1/28]
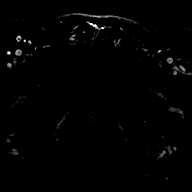

[Series 41: T1 · axial · 3.0mm · 1.15mm/px · 1 of 28 slices shown (32 of 49)]
[im 1/28]
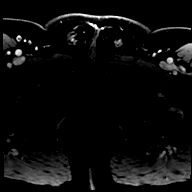

[Series 42: T1 · axial · 3.0mm · 1.15mm/px · 1 of 28 slices shown (33 of 49)]
[im 1/28]
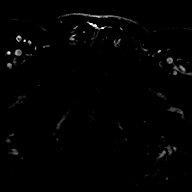

[Series 43: T1 · axial · 3.0mm · 1.15mm/px · 1 of 28 slices shown (34 of 49)]
[im 1/28]
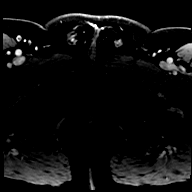

[Series 44: T1 · axial · 3.0mm · 1.15mm/px · 1 of 28 slices shown (35 of 49)]
[im 1/28]
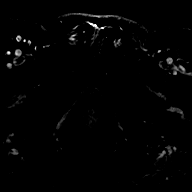

[Series 45: T1 · axial · 3.0mm · 1.15mm/px · 1 of 28 slices shown (36 of 49)]
[im 1/28]
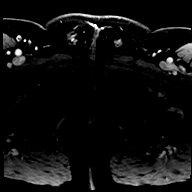

[Series 46: T1 · axial · 3.0mm · 1.15mm/px · 1 of 28 slices shown (37 of 49)]
[im 1/28]
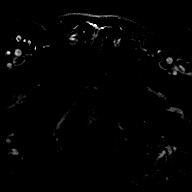

[Series 47: T1 · axial · 3.0mm · 1.15mm/px · 1 of 28 slices shown (38 of 49)]
[im 1/28]
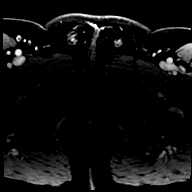

[Series 48: T1 · axial · 3.0mm · 1.15mm/px · 1 of 28 slices shown (39 of 49)]
[im 1/28]
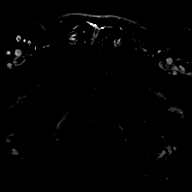

[Series 49: T1 · axial · 3.0mm · 1.15mm/px · 1 of 28 slices shown (40 of 49)]
[im 1/28]
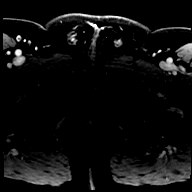

[Series 50: T1 · axial · 3.0mm · 1.15mm/px · 1 of 28 slices shown (41 of 49)]
[im 1/28]
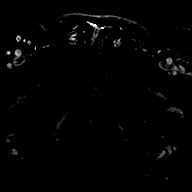

[Series 51: T1 · axial · 3.0mm · 1.15mm/px · 1 of 28 slices shown (42 of 49)]
[im 1/28]
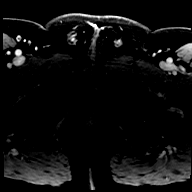

[Series 52: T1 · axial · 3.0mm · 1.15mm/px · 1 of 28 slices shown (43 of 49)]
[im 1/28]
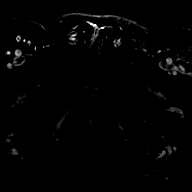

[Series 53: T1 · axial · 3.0mm · 1.15mm/px · 1 of 28 slices shown (44 of 49)]
[im 1/28]
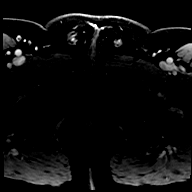

[Series 54: T1 · axial · 3.0mm · 1.15mm/px · 1 of 28 slices shown (45 of 49)]
[im 1/28]
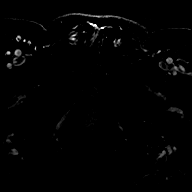

[Series 55: T1 · axial · 3.0mm · 1.15mm/px · 1 of 28 slices shown (46 of 49)]
[im 1/28]
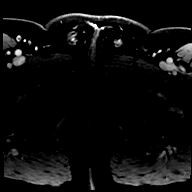

[Series 56: T1 · axial · 3.0mm · 1.15mm/px · 1 of 28 slices shown (47 of 49)]
[im 1/28]
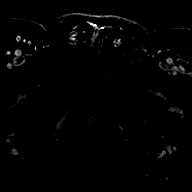

[Series 57: T1 · axial · 3.0mm · 1.15mm/px · 1 of 28 slices shown (48 of 49)]
[im 1/28]
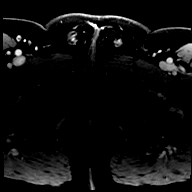

[Series 58: T1 · axial · 3.0mm · 1.15mm/px · 1 of 28 slices shown (49 of 49)]
[im 1/28]
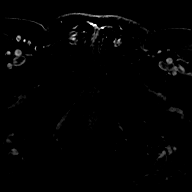

[56 of 56 positions shown; findings below may reference images not displayed]

FINDINGS: Prostate:

Region of interest # 1: PI-RADS category 4 lesion of the right
anterior peripheral zone involving the base and mid gland, with
reduced T2 signal and early enhancement. This measures 0.94 cubic cm
(1.8 by 1.0 by 1.5 cm) and is shown for example on image 11 of
series 4 and image 12 of series 22.

Region of interest # 2: PI-RADS category 4 lesion the left
posterolateral and left anterior peripheral zone in the mid gland
and left anterior peripheral zone in the apex, with reduced T2
signal and mild early enhancement. This measures 0.61 cubic cm (1.8
by 0.8 by 0.9 cm) and is shown for example on image 17 of series 4.

Region of interest # 3: PI-RADS category 5 partially encapsulated T2
hypointense lesion in the left anterior and posterior transition
zone in the base and mid gland, with restricted diffusion noted.
This measures 1.98 cubic cm (2.2 by 1.7 by 0.9 cm) and is shown for
example on image 10 of series 4.

There is in capsulated nodularity in the right transition zone with
restriction of diffusion, but the encapsulated appearance makes this
unlikely to be malignant.

Volume: 3D volumetric analysis: Prostate volume 69.95 cubic cm (7.0
by 5.1 by 4.4 cm).

Transcapsular spread:  Absent

Seminal vesicle involvement: Absent

Neurovascular bundle involvement: Absent

Pelvic adenopathy: Absent

Bone metastasis: Absent

Other findings: Sigmoid colon diverticulosis.
IMPRESSION: 1. PI-RADS category 5 lesion of the left transition zone, with
single PI-RADS category 4 lesions in the right and left peripheral
zone as detailed above. Targeting data sent to [facility].
2. No evidence of extracapsular extension, seminal vesicle
involvement, or metastatic disease.
3. Sigmoid colon diverticulosis.
4. Benign prostatic hypertrophy.  Prostate volume 69.95 cubic cm.

## 2022-07-23 ENCOUNTER — Ambulatory Visit
Admission: RE | Admit: 2022-07-23 | Discharge: 2022-07-23 | Disposition: A | Discharge status: Home / Self Care | Payer: Medicare Other | Source: Ambulatory Visit | Attending: Emergency Medicine | Admitting: Emergency Medicine

## 2022-07-23 VITALS — BP 133/89 | HR 60 | Temp 98.2°F | Resp 18

## 2022-07-23 DIAGNOSIS — R21 Rash and other nonspecific skin eruption: Secondary | ICD-10-CM

## 2022-07-23 MED ORDER — HYDROXYZINE HCL 25 MG PO TABS: 25.0000 mg | ORAL_TABLET | Freq: Four times a day (QID) | ORAL | 0 refills | Status: DC

## 2022-07-23 MED ORDER — PREDNISONE 10 MG (21) PO TBPK: ORAL_TABLET | Freq: Every day | ORAL | 0 refills | Status: DC

## 2022-07-23 NOTE — ED Provider Notes (Signed)
[facility]    CSN: [id number] Arrival date & time: 07/23/22  1127      History   Chief Complaint Chief Complaint  Patient presents with   Rash    Skin rash with itching and hives - Entered by patient    HPI Spencer Smith is a 67 y.o. male.   Patient presents for evaluation of erythematous pruritic rash present for 21 days.  Rash initially began to the right lower extremity and the neck but it is spread generally to the body.  Described as whelps.  Has been scratching persistently.  Has attempted use of hydrocortisone, calamine, antihistamines relief.  Denies changes in toiletries, diet, medications or recent travel.  No other member of household has similar symptoms.  History of rosacea.   Past Medical History:  Diagnosis Date   Anxiety    History of kidney stones    Hypercholesterolemia    Hypertension    Sleep apnea     There are no problems to display for this patient.   Past Surgical History:  Procedure Laterality Date   COLONOSCOPY  2011   PROSTATE BIOPSY N/A 05/26/2020   Procedure: PROSTATE BIOPSY Vernelle Emerald;  Surgeon: Royston Cowper, MD;  Location: [facility] ORS;  Service: Urology;  Laterality: N/A;   TONSILLECTOMY  1963       Home Medications    Prior to Admission medications   Medication Sig Start Date End Date Taking? Authorizing Provider  amLODipine (NORVASC) 5 MG tablet Take 5 mg by mouth daily. 05/09/20   [provider]  aspirin EC 81 MG tablet Take 81 mg by mouth daily. Swallow whole.    [provider]  atorvastatin (LIPITOR) 20 MG tablet Take 20 mg by mouth daily. 05/11/20   [provider]  busPIRone (BUSPAR) 10 MG tablet Take 10 mg by mouth daily. 03/05/20   [provider]  cholecalciferol (VITAMIN D3) 25 MCG (1000 UNIT) tablet Take 1,000 Units by mouth daily.    [provider]  FLUoxetine (PROZAC) 40 MG capsule Take 40 mg by mouth daily. 05/02/20   [provider]  metroNIDAZOLE  (METROGEL) 1 % gel Apply QHS to affected areas on face. 08/01/21   Brendolyn Patty, MD  Multiple Vitamin (MULTIVITAMIN WITH MINERALS) TABS tablet Take 1 tablet by mouth daily.    [provider]  naproxen (NAPROSYN) 500 MG tablet Take 1 tablet (500 mg total) by mouth 2 (two) times daily. 11/08/21   Robertha Staples, Leitha Schuller, NP  Omega-3 Fatty Acids (FISH OIL) 1000 MG CAPS Take 1,000 mg by mouth daily.    [provider]    Family History Family History  Problem Relation Age of Onset   Heart disease Mother    Dementia Father     Social History Social History   Tobacco Use   Smoking status: Former    Types: Cigarettes    Quit date: 2019    Years since quitting: 5.0   Smokeless tobacco: Never  Vaping Use   Vaping Use: Never used  Substance Use Topics   Alcohol use: Not Currently    Comment: none since 2019   Drug use: Not Currently    Types: Marijuana    Comment: several years ago     Allergies   Patient has no known allergies.   Review of Systems Review of Systems  Constitutional: Negative.   Respiratory: Negative.    Cardiovascular: Negative.   Skin:  Positive for rash. Negative for color change,  pallor and wound.  Neurological: Negative.      Physical Exam Triage Vital Signs ED Triage Vitals  Enc Vitals Group     BP 07/23/22 1207 133/89     Pulse Rate 07/23/22 1207 60     Resp 07/23/22 1207 18     Temp 07/23/22 1207 98.2 F (36.8 C)     Temp Source 07/23/22 1207 Oral     SpO2 07/23/22 1207 95 %     Weight --      Height --      Head Circumference --      Peak Flow --      Pain Score 07/23/22 1206 0     Pain Loc --      Pain Edu? --      Excl. in [place]? --    No data found.  Updated Vital Signs BP 133/89 (BP Location: Left Arm)   Pulse 60   Temp 98.2 F (36.8 C) (Oral)   Resp 18   SpO2 95%   Visual Acuity Right Eye Distance:   Left Eye Distance:   Bilateral Distance:    Right Eye Near:   Left Eye Near:    Bilateral Near:      Physical Exam Constitutional:      Appearance: Normal appearance.  Eyes:     Extraocular Movements: Extraocular movements intact.  Pulmonary:     Effort: Pulmonary effort is normal.  Skin:    Comments: Fine erythematous papular rash present to the trunk, the bilateral upper extremities and posterior neck  Neurological:     Mental Status: He is alert and oriented to person, place, and time.      UC Treatments / Results  Labs (all labs ordered are listed, but only abnormal results are displayed) Labs Reviewed - No data to display  EKG   Radiology No results found.  Procedures Procedures (including critical care time)  Medications Ordered in UC Medications - No data to display  Initial Impression / Assessment and Plan / UC Course  I have reviewed the triage vital signs and the nursing notes.  Pertinent labs & imaging results that were available during my care of the patient were reviewed by me and considered in my medical decision making (see chart for details).  Rash  Appears to be inflammatory, unknown cause, no signs of infection, discussed with patient, prednisone taper prescribed as well as hydroxyzine for management of pruritus, may continue use of topical products as needed, advised avoidance of long exposure to heat to prevent further irritation, has upcoming PCP appointment on February 12 advised to keep for reevaluation of symptoms Final Clinical Impressions(s) / UC Diagnoses   Final diagnoses:  None   Discharge Instructions   None    ED Prescriptions   None    PDMP not reviewed this encounter.   Hans Eden, NP 07/23/22 1259

## 2022-07-23 NOTE — ED Triage Notes (Signed)
Pt presents with a rash and itching all over body for several weeks.

## 2022-07-23 NOTE — Discharge Instructions (Addendum)
Today you are being treated for a rash that appears inflammatory, no current signs of infection  take prednisone every morning with food as directed, to help reduce the inflammatory process that occurs with this rash which will help minimize your itching as well as begin to clear  May use hydroxyzine every 6 hours to help manage itching  You may continue use of topical calamine or Benadryl cream to help manage itching, you may also continue oral Benadryl  Please avoid long exposures to heat such as a hot steamy shower or being outside as this may cause further irritation   Keep upcoming appointment on February 12 for reevaluation as you would have finished medication just a few days prior

## 2022-08-06 ENCOUNTER — Ambulatory Visit (INDEPENDENT_AMBULATORY_CARE_PROVIDER_SITE_OTHER): Payer: Medicare Other | Admitting: Dermatology

## 2022-08-06 ENCOUNTER — Encounter: Payer: Self-pay | Admitting: Dermatology

## 2022-08-06 VITALS — BP 137/80 | HR 59

## 2022-08-06 DIAGNOSIS — L719 Rosacea, unspecified: Secondary | ICD-10-CM

## 2022-08-06 DIAGNOSIS — L820 Inflamed seborrheic keratosis: Secondary | ICD-10-CM | POA: Diagnosis not present

## 2022-08-06 DIAGNOSIS — Z1283 Encounter for screening for malignant neoplasm of skin: Secondary | ICD-10-CM

## 2022-08-06 DIAGNOSIS — L814 Other melanin hyperpigmentation: Secondary | ICD-10-CM

## 2022-08-06 DIAGNOSIS — L578 Other skin changes due to chronic exposure to nonionizing radiation: Secondary | ICD-10-CM | POA: Diagnosis not present

## 2022-08-06 DIAGNOSIS — L300 Nummular dermatitis: Secondary | ICD-10-CM | POA: Diagnosis not present

## 2022-08-06 DIAGNOSIS — L821 Other seborrheic keratosis: Secondary | ICD-10-CM

## 2022-08-06 DIAGNOSIS — D229 Melanocytic nevi, unspecified: Secondary | ICD-10-CM | POA: Diagnosis not present

## 2022-08-06 DIAGNOSIS — D224 Melanocytic nevi of scalp and neck: Secondary | ICD-10-CM

## 2022-08-06 MED ORDER — CLOBETASOL PROPIONATE 0.05 % EX SOLN: CUTANEOUS | 1 refills | Status: DC

## 2022-08-06 MED ORDER — METRONIDAZOLE 1 % EX GEL: CUTANEOUS | 10 refills | Status: DC

## 2022-08-06 NOTE — Patient Instructions (Addendum)
Recommend continue Dove sensitive skin for soap  Recommend OTC [place] Bond Rapid Relief Anti-Itch cream (pramoxine + menthol), CeraVe Anti-itch cream or lotion (pramoxine), Sarna lotion (Original- menthol + camphor or Sensitive- pramoxine) or Eucerin 12 hour Itch Relief lotion (menthol) up to 3 times per day to areas on body that are itchy.   Clobetasol mix   Eczema Skin Care  Buy TWO 16oz jars of CeraVe moisturizing cream  [facility], [facility], [facility] (no prescription needed)  Costs about $15 per jar   Jar #1: Use as a moisturizer as needed. Can be applied to any area of the body. Use twice daily to unaffected areas.  Jar #2: Pour one 42m bottle of clobetasol 0.05% solution into jar, mix well. Label this jar to indicate the medication has been added. Use twice daily to affected areas. Do not apply to face, groin or underarms.  Moisturizer may burn or sting initially. Try for at least 4 weeks.   Topical steroids (such as triamcinolone, fluocinolone, fluocinonide, mometasone, clobetasol, halobetasol, betamethasone, hydrocortisone) can cause thinning and lightening of the skin if they are used for too long in the same area. Your physician has selected the right strength medicine for your problem and area affected on the body. Please use your medication only as directed by your physician to prevent side effects.    Gentle Skin Care Guide  1. Bathe no more than once a day.  2. Avoid bathing in hot water  3. Use a mild soap like Dove, Vanicream, Cetaphil, CeraVe. Can use Lever 2000 or Cetaphil antibacterial soap  4. Use soap only where you need it. On most days, use it under your arms, between your legs, and on your feet. Let the water rinse other areas unless visibly dirty.  5. When you get out of the bath/shower, use a towel to gently blot your skin dry, don't rub it.  6. While your skin is still a little damp, apply a moisturizing cream such as Vanicream, CeraVe, Cetaphil, Eucerin, Sarna  lotion or plain Vaseline Jelly. For hands apply Neutrogena [place]Hand Cream or Excipial Hand Cream.  7. Reapply moisturizer any time you start to itch or feel dry.  8. Sometimes using free and clear laundry detergents can be helpful. Fabric softener sheets should be avoided. Downy Free & Gentle liquid, or any liquid fabric softener that is free of dyes and perfumes, it acceptable to use  9. If your doctor has given you prescription creams you may apply moisturizers over them    Cryotherapy Aftercare  Wash gently with soap and water everyday.   Apply Vaseline and Band-Aid daily until healed.   Seborrheic Keratosis  What causes seborrheic keratoses? Seborrheic keratoses are harmless, common skin growths that first appear during adult life.  As time goes by, more growths appear.  Some people may develop a large number of them.  Seborrheic keratoses appear on both covered and uncovered body parts.  They are not caused by sunlight.  The tendency to develop seborrheic keratoses can be inherited.  They vary in color from skin-colored to gray, brown, or even black.  They can be either smooth or have a rough, warty surface.   Seborrheic keratoses are superficial and look as if they were stuck on the skin.  Under the microscope this type of keratosis looks like layers upon layers of skin.  That is why at times the top layer may seem to fall off, but the rest of the growth remains and re-grows.  Treatment Seborrheic keratoses do not need to be treated, but can easily be removed in the office.  Seborrheic keratoses often cause symptoms when they rub on clothing or jewelry.  Lesions can be in the way of shaving.  If they become inflamed, they can cause itching, soreness, or burning.  Removal of a seborrheic keratosis can be accomplished by freezing, burning, or surgery. If any spot bleeds, scabs, or grows rapidly, please return to have it checked, as these can be an indication of a skin  cancer.   Melanoma ABCDEs  Melanoma is the most dangerous type of skin cancer, and is the leading cause of death from skin disease.  You are more likely to develop melanoma if you: Have light-colored skin, light-colored eyes, or red or blond hair Spend a lot of time in the sun Tan regularly, either outdoors or in a tanning bed Have had blistering sunburns, especially during childhood Have a close family member who has had a melanoma Have atypical moles or large birthmarks  Early detection of melanoma is key since treatment is typically straightforward and cure rates are extremely high if we catch it early.   The first sign of melanoma is often a change in a mole or a new dark spot.  The ABCDE system is a way of remembering the signs of melanoma.  A for asymmetry:  The two halves do not match. B for border:  The edges of the growth are irregular. C for color:  A mixture of colors are present instead of an even brown color. D for diameter:  Melanomas are usually (but not always) greater than 74m - the size of a pencil eraser. E for evolution:  The spot keeps changing in size, shape, and color.  Please check your skin once per month between visits. You can use a small mirror in front and a large mirror behind you to keep an eye on the back side or your body.   If you see any new or changing lesions before your next follow-up, please call to schedule a visit.  Please continue daily skin protection including broad spectrum sunscreen SPF 30+ to sun-exposed areas, reapplying every 2 hours as needed when you're outdoors.   Staying in the shade or wearing long sleeves, sun glasses (UVA+UVB protection) and wide brim hats (4-inch brim around the entire circumference of the hat) are also recommended for sun protection.    Due to recent changes in healthcare laws, you may see results of your pathology and/or laboratory studies on MyChart before the doctors have had a chance to review them. We  understand that in some cases there may be results that are confusing or concerning to you. Please understand that not all results are received at the same time and often the doctors may need to interpret multiple results in order to provide you with the best plan of care or course of treatment. Therefore, we ask that you please give u[place]2 business days to thoroughly review all your results before contacting the office for clarification. Should we see a critical lab result, you will be contacted sooner.   If You Need Anything After Your Visit  If you have any questions or concerns for your doctor, please call our main line at [phone] press option 4 to reach your doctor's medical assistant. If no one answers, please leave a voicemail as directed and we will return your call as soon as possible. Messages left after 4 pm will be answered the  following business day.   You may also send [place] a message via [place]. We typically respond to MyChart messages within 1-2 business days.  For prescription refills, please ask your pharmacy to contact our office. Our fax number is [phone].  If you have an urgent issue when the clinic is closed that cannot wait until the next business day, you can page your doctor at the number below.    Please note that while we do our best to be available for urgent issues outside of office hours, we are not available 24/7.   If you have an urgent issue and are unable to reach [place], you may choose to seek medical care at your doctor's office, retail clinic, urgent care center, or emergency room.  If you have a medical emergency, please immediately call 911 or go to the emergency department.  Pager Numbers  - Dr. Nehemiah Massed: [phone]  - Dr. Laurence Ferrari: [phone]  - Dr. Nicole Kindred: [phone]  In the event of inclement weather, please call our main line at [phone] for an update on the status of any delays or closures.  Dermatology Medication Tips: Please  keep the boxes that topical medications come in in order to help keep track of the instructions about where and how to use these. Pharmacies typically print the medication instructions only on the boxes and not directly on the medication tubes.   If your medication is too expensive, please contact our office at [phone] option 4 or send [place] a message through [place].   We are unable to tell what your co-pay for medications will be in advance as this is different depending on your insurance coverage. However, we may be able to find a substitute medication at lower cost or fill out paperwork to get insurance to cover a needed medication.   If a prior authorization is required to get your medication covered by your insurance company, please allow [place] 1-2 business days to complete this process.  Drug prices often vary depending on where the prescription is filled and some pharmacies may offer cheaper prices.  The website www.goodrx.com contains coupons for medications through different pharmacies. The prices here do not account for what the cost may be with help from insurance (it may be cheaper with your insurance), but the website can give you the price if you did not use any insurance.  - You can print the associated coupon and take it with your prescription to the pharmacy.  - You may also stop by our office during regular business hours and pick up a GoodRx coupon card.  - If you need your prescription sent electronically to a different pharmacy, notify our office through [facility] or by phone at [phone] option 4.     Si Usted Necesita Algo Despus de Su Visita  Tambin puede enviarnos un mensaje a travs de Pharmacist, community. Por lo general respondemos a los mensajes de MyChart en el transcurso de 1 a 2 das hbiles.  Para renovar recetas, por favor pida a su farmacia que se ponga en contacto con nuestra oficina. Harland Dingwall de fax es [place] [phone].  Si tiene un asunto urgente  cuando la clnica est cerrada y que no puede esperar hasta el siguiente da hbil, puede llamar/localizar a su doctor(a) al nmero que aparece a continuacin.   Por favor, tenga en cuenta que aunque hacemos todo lo posible para estar disponibles para asuntos urgentes fuera del horario de oficina, no estamos disponibles las 24 horas del da, los 7  das de [employer].   Si tiene un problema urgente y no puede comunicarse con nosotros, puede optar por buscar atencin mdica  en el consultorio de su doctor(a), en una clnica privada, en un centro de atencin urgente o en una sala de emergencias.  Si tiene Engineering geologist, por favor llame inmediatamente al 911 o vaya a la sala de emergencias.  Nmeros de bper  - Dr. Nehemiah Massed: [phone]  - Dra. Moye: [phone]  - Dra. Nicole Kindred: [phone]  En caso de inclemencias del [place], por favor llame a Johnsie Kindred principal al [phone] para una actualizacin sobre el [place] de cualquier retraso o cierre.  Consejos para la medicacin en dermatologa: Por favor, guarde las cajas en las que vienen los medicamentos de uso tpico para ayudarle a seguir las instrucciones sobre dnde y cmo usarlos. Las farmacias generalmente imprimen las instrucciones del medicamento slo en las cajas y no directamente en los tubos del [place].   Si su medicamento es muy caro, por favor, pngase en contacto con Zigmund Daniel llamando al [phone] y presione la opcin 4 o envenos un mensaje a travs de Pharmacist, community.   No podemos decirle cul ser su copago por los medicamentos por adelantado ya que esto es diferente dependiendo de la cobertura de su seguro. Sin embargo, es posible que podamos encontrar un medicamento sustituto a Electrical engineer un formulario para que el seguro cubra el medicamento que se considera necesario.   Si se requiere una autorizacin previa para que su compaa de seguros [place] su medicamento, por favor permtanos de 1 a 2  das hbiles para completar este proceso.  Los precios de los medicamentos varan con frecuencia dependiendo del Environmental consultant de dnde se surte la receta y alguna farmacias pueden ofrecer precios ms baratos.  El sitio web www.goodrx.com tiene cupones para medicamentos de Airline pilot. Los precios aqu no tienen en cuenta lo que podra costar con la ayuda del seguro (puede ser ms barato con su seguro), pero el sitio web puede darle el precio si no utiliz Research scientist (physical sciences).  - Puede imprimir el cupn correspondiente y llevarlo con su receta a la farmacia.  - Tambin puede pasar por nuestra oficina durante el horario de atencin regular y Charity fundraiser una tarjeta de cupones de GoodRx.  - Si necesita que su receta se enve electrnicamente a una farmacia diferente, informe a nuestra oficina a travs de MyChart de Industry o por telfono llamando al [phone] y presione la opcin 4.

## 2022-08-06 NOTE — Progress Notes (Signed)
Follow-Up Visit   Subjective  Spencer Smith is a 67 y.o. male who presents for the following: Annual Exam (1 year tbse , h/o rosacea, patient reports itchy around neck, back and legs since December. Seen by urgent care given rx of prednisone and antihistimine. Reports helped but itch has returned. ).  He has several growths that get irritated.  The patient presents for Total-Body Skin Exam (TBSE) for skin cancer screening and mole check.  The patient has spots, moles and lesions to be evaluated, some may be new or changing and the patient has concerns that these could be cancer.   The following portions of the chart were reviewed this encounter and updated as appropriate:      Review of Systems: No other skin or systemic complaints except as noted in HPI or Assessment and Plan.   Objective  Well appearing patient in no apparent distress; mood and affect are within normal limits.  A full examination was performed including scalp, head, eyes, ears, nose, lips, neck, chest, axillae, abdomen, back, buttocks, bilateral upper extremities, bilateral lower extremities, hands, feet, fingers, toes, fingernails, and toenails. All findings within normal limits unless otherwise noted below.  face Erythema on nose with sebaceous hypertrophy   neck, chest, back, arms Pink macules and papules on arms, neck, chest, and back  left posterior shoulder x1, left forearm x 1, left occipital scalp x 1 (3) Erythematous stuck-on, waxy papule or plaque  right occipital scalp 4 mm flesh papule    Assessment & Plan  Rosacea face  Chronic condition with duration or expected duration over one year. Currently well-controlled.    Rosacea is a chronic progressive skin condition usually affecting the face of adults, causing redness and/or acne bumps. It is treatable but not curable. It sometimes affects the eyes (ocular rosacea) as well. It may respond to topical and/or systemic medication and can flare  with stress, sun exposure, alcohol, exercise and some foods.  Daily application of broad spectrum spf 30+ sunscreen to face is recommended to reduce flares.   Continue metrogel 1% gel qd   metroNIDAZOLE (METROGEL) 1 % gel - face Apply QHS to affected areas on face.  Nummular dermatitis neck, chest, back, arms  Chronic and persistent condition with duration or expected duration over one year. Condition is bothersome/symptomatic for patient. Currently flared.   Recommend mild soap and moisturizing cream 1-2 times daily.  Gentle skin care handout provided.    Recommend OTC Gold Bond Rapid Relief Anti-Itch cream (pramoxine + menthol), CeraVe Anti-itch cream or lotion (pramoxine), Sarna lotion (Original- menthol + camphor or Sensitive- pramoxine) or Eucerin 12 hour Itch Relief lotion (menthol) up to 3 times per day to areas on body that are itchy.  Start   Eczema Skin Care  Buy TWO 16oz jars of CeraVe moisturizing cream  [facility], [facility], [facility] (no prescription needed)  Costs about $15 per jar   Jar #1: Use as a moisturizer as needed. Can be applied to any area of the body. Use twice daily to unaffected areas.  Jar #2: Pour one 55m bottle of clobetasol 0.05% solution into jar, mix well. Label this jar to indicate the medication has been added. Use twice daily to affected areas. Do not apply to face, groin or underarms.  Moisturizer may burn or sting initially. Try for at least 4 weeks.    clobetasol (TEMOVATE) 0.05 % external solution - neck, chest, back, arms Use as directed. Refer to patient handout. Avoid applying to face,  groin, and axilla. Use as directed.  Inflamed seborrheic keratosis (3) left posterior shoulder x1, left forearm x 1, left occipital scalp x 1  Symptomatic, irritating, patient would like treated.  Destruction of lesion - left posterior shoulder x1, left forearm x 1, left occipital scalp x 1  Destruction method: cryotherapy   Informed consent: discussed and  consent obtained   Lesion destroyed using liquid nitrogen: Yes   Region frozen until ice ball extended beyond lesion: Yes   Outcome: patient tolerated procedure well with no complications   Post-procedure details: wound care instructions given   Additional details:  Prior to procedure, discussed risks of blister formation, small wound, skin dyspigmentation, or rare scar following cryotherapy. Recommend Vaseline ointment to treated areas while healing.   Nevus right occipital scalp  Benign-appearing.  Observation.  Call clinic for new or changing lesions.  Recommend daily use of broad spectrum spf 30+ sunscreen to sun-exposed areas.   Discussed shave removal if irritated   Lentigines - Scattered tan macules - Due to sun exposure - Benign-appearing, observe - Recommend daily broad spectrum sunscreen SPF 30+ to sun-exposed areas, reapply every 2 hours as needed. - Call for any changes  Seborrheic Keratoses - Stuck-on, waxy, tan-brown papules and/or plaques  - Benign-appearing - Discussed benign etiology and prognosis. - Observe - Call for any changes  Sebaceous Hyperplasia Left paranasal  - Small pink yellow papules with a central dell - Benign - Observe  Melanocytic Nevi - Tan-brown and/or pink-flesh-colored symmetric macules and papules - Benign appearing on exam today - Observation - Call clinic for new or changing moles - Recommend daily use of broad spectrum spf 30+ sunscreen to sun-exposed areas.   Hemangiomas - Red papules - Discussed benign nature - Observe - Call for any changes  Actinic Damage - Chronic condition, secondary to cumulative UV/sun exposure - diffuse scaly erythematous macules with underlying dyspigmentation - Recommend daily broad spectrum sunscreen SPF 30+ to sun-exposed areas, reapply every 2 hours as needed.  - Staying in the shade or wearing long sleeves, sun glasses (UVA+UVB protection) and wide brim hats (4-inch brim around the entire  circumference of the hat) are also recommended for sun protection.  - Call for new or changing lesions.  Skin cancer screening performed today.  I, Ruthell Rummage, CMA, am acting as scribe for Brendolyn Patty, MD.  Return for 4 week dermatitis follow up, 1 year tbse .  Documentation: I have reviewed the above documentation for accuracy and completeness, and I agree with the above.  Brendolyn Patty MD

## 2022-08-07 ENCOUNTER — Ambulatory Visit (INDEPENDENT_AMBULATORY_CARE_PROVIDER_SITE_OTHER): Payer: Medicare Other | Admitting: Internal Medicine

## 2022-08-07 ENCOUNTER — Encounter: Payer: Self-pay | Admitting: Internal Medicine

## 2022-08-07 VITALS — BP 120/62 | HR 73 | Ht 72.0 in | Wt 222.0 lb

## 2022-08-07 DIAGNOSIS — I10 Essential (primary) hypertension: Secondary | ICD-10-CM | POA: Diagnosis not present

## 2022-08-07 DIAGNOSIS — F419 Anxiety disorder, unspecified: Secondary | ICD-10-CM | POA: Diagnosis not present

## 2022-08-07 DIAGNOSIS — E782 Mixed hyperlipidemia: Secondary | ICD-10-CM | POA: Diagnosis not present

## 2022-08-07 DIAGNOSIS — G4730 Sleep apnea, unspecified: Secondary | ICD-10-CM | POA: Insufficient documentation

## 2022-08-07 DIAGNOSIS — G4733 Obstructive sleep apnea (adult) (pediatric): Secondary | ICD-10-CM

## 2022-08-07 DIAGNOSIS — E7800 Pure hypercholesterolemia, unspecified: Secondary | ICD-10-CM | POA: Insufficient documentation

## 2022-08-07 HISTORY — DX: Obstructive sleep apnea (adult) (pediatric): G47.33

## 2022-08-07 NOTE — Progress Notes (Signed)
Established Patient Office Visit  Subjective:  Patient ID: Spencer Smith, male    DOB: 10-10-1955  Age: 67 y.o. MRN: [id number]  Chief Complaint  Patient presents with   Follow-up    6 month follow up    Patient comes in for 29-monthfollow-up today.  He has gained some weight.  Understands that he has to start his diet and exercise program.  Recently his wife has been having health issues and that is part of the reason that he has not been careful with his diet. He was recently seen by  his dermatologist for a Papular rash and diagnosed with Nummular Dermatitis.  He is taking medications prescribed by them. He has no new complaints otherwise.     Past Medical History:  Diagnosis Date   Anxiety    History of kidney stones    Hypercholesterolemia    Hypertension    Sleep apnea     Social History   Socioeconomic History   Marital status: Married    Spouse name: JAnderson Malta  Number of children: Not on file   Years of education: Not on file   Highest education level: Not on file  Occupational History   Occupation: retired  Tobacco Use   Smoking status: Former    Types: Cigarettes    Quit date: 2019    Years since quitting: 5.1   Smokeless tobacco: Never  Vaping Use   Vaping Use: Never used  Substance and Sexual Activity   Alcohol use: Not Currently    Comment: none since 2019   Drug use: Not Currently    Types: Marijuana    Comment: several years ago   Sexual activity: Not on file  Other Topics Concern   Not on file  Social History Narrative   Not on file   Social Determinants of Health   Financial Resource Strain: Not on file  Food Insecurity: Not on file  Transportation Needs: Not on file  Physical Activity: Not on file  Stress: Not on file  Social Connections: Not on file  Intimate Partner Violence: Not on file    Family History  Problem Relation Age of Onset   Heart disease Mother    Dementia Father     No Known Allergies  Review of Systems   Constitutional:  Negative for chills, fever, malaise/fatigue and weight loss.  HENT:  Negative for congestion, ear discharge, ear pain, hearing loss, nosebleeds and tinnitus.   Eyes:  Negative for blurred vision, double vision, photophobia, pain, discharge and redness.  Respiratory:  Negative for cough, hemoptysis, shortness of breath and wheezing.   Cardiovascular:  Negative for chest pain, palpitations, orthopnea, claudication, leg swelling and PND.  Gastrointestinal:  Negative for abdominal pain, blood in stool, constipation, heartburn, melena, nausea and vomiting.  Genitourinary:  Negative for dysuria, frequency, hematuria and urgency.  Musculoskeletal:  Negative for myalgias and neck pain.  Skin:  Positive for itching and rash.  Neurological:  Negative for dizziness and headaches.  Psychiatric/Behavioral:  Negative for depression. The patient is not nervous/anxious and does not have insomnia.        Objective:   BP 120/62   Pulse 73   Ht 6' (1.829 m)   Wt 222 lb (100.7 kg)   SpO2 96%   BMI 30.11 kg/m   Vitals:   08/07/22 1034  BP: 120/62  Pulse: 73  Height: 6' (1.829 m)  Weight: 222 lb (100.7 kg)  SpO2: 96%  BMI (Calculated): 30.1  Physical Exam Vitals and nursing note reviewed.  Constitutional:      Appearance: Normal appearance. He is obese.  HENT:     Head: Normocephalic.  Cardiovascular:     Rate and Rhythm: Normal rate and regular rhythm.     Pulses: Normal pulses.     Heart sounds: Normal heart sounds.  Pulmonary:     Effort: Pulmonary effort is normal.     Breath sounds: Normal breath sounds.  Abdominal:     General: Abdomen is flat. Bowel sounds are normal.     Palpations: Abdomen is soft.  Musculoskeletal:        General: Normal range of motion.     Cervical back: Normal range of motion and neck supple.  Neurological:     General: No focal deficit present.     Mental Status: He is alert.  Psychiatric:        Mood and Affect: Mood normal.       No results found for any visits on 08/07/22.  No results found for this or any previous visit (from the past 2160 hour(s)).    Assessment & Plan:   Problem List Items Addressed This Visit     Anxiety   Essential hypertension, benign - Primary   Relevant Orders   CMP14+EGFR   Sleep apnea   Mixed hyperlipidemia   Relevant Orders   Lipid Panel w/o Chol/HDL Ratio    Return in about 6 months (around 02/05/2023).   Total time spent: 20 minutes  Perrin Maltese, MD  08/07/2022

## 2022-08-09 ENCOUNTER — Other Ambulatory Visit: Payer: Medicare Other

## 2022-08-09 ENCOUNTER — Other Ambulatory Visit: Payer: Self-pay | Admitting: Internal Medicine

## 2022-08-09 DIAGNOSIS — E782 Mixed hyperlipidemia: Secondary | ICD-10-CM

## 2022-08-09 DIAGNOSIS — E7800 Pure hypercholesterolemia, unspecified: Secondary | ICD-10-CM

## 2022-08-09 DIAGNOSIS — I10 Essential (primary) hypertension: Secondary | ICD-10-CM

## 2022-08-10 LAB — CMP14+EGFR
ALT: 23 IU/L (ref 0–44)
AST: 19 IU/L (ref 0–40)
Albumin/Globulin Ratio: 2.2 (ref 1.2–2.2)
Albumin: 4.6 g/dL (ref 3.9–4.9)
Alkaline Phosphatase: 99 IU/L (ref 44–121)
BUN/Creatinine Ratio: 14 (ref 10–24)
BUN: 12 mg/dL (ref 8–27)
Bilirubin Total: 0.7 mg/dL (ref 0.0–1.2)
CO2: 22 mmol/L (ref 20–29)
Calcium: 9.5 mg/dL (ref 8.6–10.2)
Chloride: 101 mmol/L (ref 96–106)
Creatinine, Ser: 0.83 mg/dL (ref 0.76–1.27)
Globulin, Total: 2.1 g/dL (ref 1.5–4.5)
Glucose: 90 mg/dL (ref 70–99)
Potassium: 4.6 mmol/L (ref 3.5–5.2)
Sodium: 138 mmol/L (ref 134–144)
Total Protein: 6.7 g/dL (ref 6.0–8.5)
eGFR: 96 mL/min/{1.73_m2} (ref >59)

## 2022-08-10 LAB — LIPID PANEL W/O CHOL/HDL RATIO
Cholesterol, Total: 173 mg/dL (ref 100–199)
HDL: 68 mg/dL (ref >39)
LDL Chol Calc (NIH): 83 mg/dL (ref 0–99)
Triglycerides: 129 mg/dL (ref 0–149)
VLDL Cholesterol Cal: 22 mg/dL (ref 5–40)

## 2022-08-31 ENCOUNTER — Telehealth: Payer: Self-pay

## 2022-08-31 MED ORDER — FLUOXETINE HCL 40 MG PO CAPS: 40.0000 mg | ORAL_CAPSULE | Freq: Every day | ORAL | 3 refills | Status: DC

## 2022-08-31 NOTE — Telephone Encounter (Signed)
Rx refill fluoxetine sent

## 2022-09-05 ENCOUNTER — Telehealth: Payer: Self-pay

## 2022-09-05 ENCOUNTER — Ambulatory Visit: Payer: Medicare Other | Admitting: Dermatology

## 2022-09-05 VITALS — BP 117/67 | HR 56

## 2022-09-05 DIAGNOSIS — Z79899 Other long term (current) drug therapy: Secondary | ICD-10-CM

## 2022-09-05 DIAGNOSIS — L821 Other seborrheic keratosis: Secondary | ICD-10-CM | POA: Diagnosis not present

## 2022-09-05 DIAGNOSIS — L300 Nummular dermatitis: Secondary | ICD-10-CM | POA: Diagnosis not present

## 2022-09-05 DIAGNOSIS — T148XXA Other injury of unspecified body region, initial encounter: Secondary | ICD-10-CM | POA: Diagnosis not present

## 2022-09-05 MED ORDER — TACROLIMUS 0.1 % EX OINT: TOPICAL_OINTMENT | CUTANEOUS | 1 refills | Status: DC

## 2022-09-05 NOTE — Progress Notes (Signed)
Follow-Up Visit   Subjective  Spencer Smith is a 67 y.o. male who presents for the following: Nummular Dermatitis (Trunk, extremities. Patient is using Clobetasol/CeraVe mix twice daily. Some areas have improved, but he is still breaking out. ). Still itches.  Rash started in December 2023 after he had been sick. No history of eczema in the past.    The following portions of the chart were reviewed this encounter and updated as appropriate:       Review of Systems:  No other skin or systemic complaints except as noted in HPI or Assessment and Plan.  Objective  Well appearing patient in no apparent distress; mood and affect are within normal limits.  A focused examination was performed including face, arms. Relevant physical exam findings are noted in the Assessment and Plan.  trunk; extremities Scattered pink excoriated papules on the upper back; scattered pink papules on the arms, chest.  Upper Back Excoriation of the upper back/ left post lower neck- healing, benign-appearing under dermoscopy         Assessment & Plan   Seborrheic Keratoses - Stuck-on, waxy, tan-brown papules and/or plaques  - Benign-appearing - Discussed benign etiology and prognosis. - Observe - Call for any changes  Nummular dermatitis trunk; extremities  vs Allergic Contact Dermatitis vs atopic dermatitis.  Chronic and persistent condition with duration or expected duration over one year. Condition is symptomatic/ bothersome to patient. Not currently at goal.   Atopic dermatitis (eczema) is a chronic, relapsing, pruritic condition that can significantly affect quality of life. It is often associated with allergic rhinitis and/or asthma and can require treatment with topical medications, phototherapy, or in severe cases biologic injectable medication (Dupixent; Adbry) or Oral JAK inhibitors.   Recommend mild soap and moisturizing cream 1-2 times daily.  Gentle skin care handout provided.    Start tacrolimus ointment Apply to AA rash QD/BID prn dsp 60g 1Rf.  Continue Clobetasol/CeraVe mix Apply QD/BID prn.  Topical steroids (such as triamcinolone, fluocinolone, fluocinonide, mometasone, clobetasol, halobetasol, betamethasone, hydrocortisone) can cause thinning and lightening of the skin if they are used for too long in the same area. Your physician has selected the right strength medicine for your problem and area affected on the body. Please use your medication only as directed by your physician to prevent side effects.   Recommend TRUE Test Patch Testing. Plan application next week, test ordered.  If Patch Testing negative, consider Dupixent injections  Dupilumab (Dupixent) is a treatment given by injection for adults and children with moderate-to-severe atopic dermatitis. Goal is control of skin condition, not cure. It is given as 2 injections at the first dose followed by 1 injection ever 2 weeks thereafter.  Young children are dosed monthly.  Potential side effects include allergic reaction, herpes infections, injection site reactions and conjunctivitis (inflammation of the eyes).  The use of Dupixent requires long term medication management, including periodic office visits.   tacrolimus (PROTOPIC) 0.1 % ointment - trunk; extremities Apply to AA rash QD/BID prn.  Related Procedures Patch Test  Related Medications clobetasol (TEMOVATE) 0.05 % external solution Use as directed. Refer to patient handout. Avoid applying to face, groin, and axilla. Use as directed.  Excoriation Upper Back  vs BCC.  Recheck on follow-up.  Consider biopsy if not clear   Return in about 5 days (around 09/10/2022) for Patch Testing application with nurse. f/u Dr Chauncey Cruel on day 7 Patch Test..  I, Jamesetta Orleans, CMA, am acting as scribe for [employer],  MD .  Documentation: I have reviewed the above documentation for accuracy and completeness, and I agree with the above.  Brendolyn Patty MD

## 2022-09-05 NOTE — Patient Outreach (Signed)
  Care Coordination   Initial Visit Note   09/05/2022 Name: Spencer Smith MRN: [id number] DOB: 1956/04/08  Spencer Smith REASON is a 67 y.o. year old male who sees Spencer Maltese, MD for primary care. I spoke with  Spencer Smith by phone today.  What matters to the patients health and wellness today?  Patient verbalized no nursing or community resource needs. Patient declined care coordination services.     Goals Addressed             This Visit's Progress    COMPLETED: care coordination activities - no further follow up required.       Interventions Today    Flowsheet Row Most Recent Value  General Interventions   General Interventions Discussed/Reviewed General Interventions Discussed, Vaccines, Health Screening  [care coordination services discussed.  SDOH survey completed. AWV visit discussed. Patient advised to call PCP office to schedule. Advised to call PCP office if care coordination services needed in the future.]  Vaccines RSV, Shingles, Pneumonia  [Patient encouraged to get vaccines]              SDOH assessments and interventions completed:  Yes  SDOH Interventions Today    Flowsheet Row Most Recent Value  SDOH Interventions   Food Insecurity Interventions Intervention Not Indicated  Housing Interventions Intervention Not Indicated  Transportation Interventions Intervention Not Indicated        Care Coordination Interventions:  Yes, provided   Follow up plan: No further intervention required.   Encounter Outcome:  Pt. Visit Completed   Spencer Plowman RN,BSN,CCM [place] [phone] direct line

## 2022-09-05 NOTE — Patient Instructions (Addendum)
Start Zyrtec or Allegra in the morning. Start Hydroxyzine every evening, may make drowsy.  Do not take antihistamines during Patch Testing.  Due to recent changes in healthcare laws, you may see results of your pathology and/or laboratory studies on MyChart before the doctors have had a chance to review them. We understand that in some cases there may be results that are confusing or concerning to you. Please understand that not all results are received at the same time and often the doctors may need to interpret multiple results in order to provide you with the best plan of care or course of treatment. Therefore, we ask that you please give [place] 2 business days to thoroughly review all your results before contacting the office for clarification. Should we see a critical lab result, you will be contacted sooner.   If You Need Anything After Your Visit  If you have any questions or concerns for your doctor, please call our main line at [phone] and press option 4 to reach your doctor's medical assistant. If no one answers, please leave a voicemail as directed and we will return your call as soon as possible. Messages left after 4 pm will be answered the following business day.   You may also send [place] a message via [place]. We typically respond to MyChart messages within 1-2 business days.  For prescription refills, please ask your pharmacy to contact our office. Our fax number is [phone].  If you have an urgent issue when the clinic is closed that cannot wait until the next business day, you can page your doctor at the number below.    Please note that while we do our best to be available for urgent issues outside of office hours, we are not available 24/7.   If you have an urgent issue and are unable to reach [place], you may choose to seek medical care at your doctor's office, retail clinic, urgent care center, or emergency room.  If you have a medical emergency, please immediately call 911 or  go to the emergency department.  Pager Numbers  - Dr. Nehemiah Massed: [phone]  - Dr. Laurence Ferrari: [phone]  - Dr. Nicole Kindred: [phone]  In the event of inclement weather, please call our main line at [phone] for an update on the status of any delays or closures.  Dermatology Medication Tips: Please keep the boxes that topical medications come in in order to help keep track of the instructions about where and how to use these. Pharmacies typically print the medication instructions only on the boxes and not directly on the medication tubes.   If your medication is too expensive, please contact our office at [phone] option 4 or send [place] a message through [place].   We are unable to tell what your co-pay for medications will be in advance as this is different depending on your insurance coverage. However, we may be able to find a substitute medication at lower cost or fill out paperwork to get insurance to cover a needed medication.   If a prior authorization is required to get your medication covered by your insurance company, please allow [place] 1-2 business days to complete this process.  Drug prices often vary depending on where the prescription is filled and some pharmacies may offer cheaper prices.  The website www.goodrx.com contains coupons for medications through different pharmacies. The prices here do not account for what the cost may be with help from insurance (it may be cheaper with your insurance), but the website can  give you the price if you did not use any insurance.  - You can print the associated coupon and take it with your prescription to the pharmacy.  - You may also stop by our office during regular business hours and pick up a GoodRx coupon card.  - If you need your prescription sent electronically to a different pharmacy, notify our office through [facility] or by phone at [phone] option 4.     Si Usted Necesita Algo Despus de Su Visita  Tambin  puede enviarnos un mensaje a travs de Pharmacist, community. Por lo general respondemos a los mensajes de MyChart en el transcurso de 1 a 2 das hbiles.  Para renovar recetas, por favor pida a su farmacia que se ponga en contacto con nuestra oficina. Harland Dingwall de fax es [place] [phone].  Si tiene un asunto urgente cuando la clnica est cerrada y que no puede esperar hasta el siguiente da hbil, puede llamar/localizar a su doctor(a) al nmero que aparece a continuacin.   Por favor, tenga en cuenta que aunque hacemos todo lo posible para estar disponibles para asuntos urgentes fuera del horario de [place], no estamos disponibles las 24 horas del da, los 7 das de la [place].   Si tiene un problema urgente y no puede comunicarse con nosotros, puede optar por buscar atencin mdica  en el consultorio de su doctor(a), en una clnica privada, en un centro de atencin urgente o en una sala de emergencias.  Si tiene Engineering geologist, por favor llame inmediatamente al 911 o vaya a la sala de emergencias.  Nmeros de bper  - Dr. Nehemiah Massed: [phone]  - Dra. Moye: [phone]  - Dra. Nicole Kindred: [phone]  En caso de inclemencias del [place], por favor llame a Johnsie Kindred principal al [phone] para una actualizacin sobre el [place] de cualquier retraso o cierre.  Consejos para la medicacin en dermatologa: Por favor, guarde las cajas en las que vienen los medicamentos de uso tpico para ayudarle a seguir las instrucciones sobre dnde y cmo usarlos. Las farmacias generalmente imprimen las instrucciones del medicamento slo en las cajas y no directamente en los tubos del [place].   Si su medicamento es muy caro, por favor, pngase en contacto con Zigmund Daniel llamando al [phone] y presione la opcin 4 o envenos un mensaje a travs de Pharmacist, community.   No podemos decirle cul ser su copago por los medicamentos por adelantado ya que esto es diferente dependiendo de la cobertura de su  seguro. Sin embargo, es posible que podamos encontrar un medicamento sustituto a Electrical engineer un formulario para que el seguro cubra el medicamento que se considera necesario.   Si se requiere una autorizacin previa para que su compaa de seguros [place] su medicamento, por favor permtanos de 1 a 2 das hbiles para completar este proceso.  Los precios de los medicamentos varan con frecuencia dependiendo del Environmental consultant de dnde se surte la receta y alguna farmacias pueden ofrecer precios ms baratos.  El sitio web www.goodrx.com tiene cupones para medicamentos de Airline pilot. Los precios aqu no tienen en cuenta lo que podra costar con la ayuda del seguro (puede ser ms barato con su seguro), pero el sitio web puede darle el precio si no utiliz Research scientist (physical sciences).  - Puede imprimir el cupn correspondiente y llevarlo con su receta a la farmacia.  - Tambin puede pasar por nuestra oficina durante el horario de atencin regular y Charity fundraiser una tarjeta de cupones de GoodRx.  -  Si necesita que su receta se enve electrnicamente a Chiropodist, informe a nuestra oficina a travs de MyChart de [place] o por telfono llamando al [phone] y presione la opcin 4.

## 2022-09-10 ENCOUNTER — Ambulatory Visit (INDEPENDENT_AMBULATORY_CARE_PROVIDER_SITE_OTHER): Payer: Medicare Other

## 2022-09-10 DIAGNOSIS — R21 Rash and other nonspecific skin eruption: Secondary | ICD-10-CM | POA: Diagnosis not present

## 2022-09-10 NOTE — Progress Notes (Signed)
Patient here today for Patch test placement. True test 36 was placed on patients upper back using standard technique. Patient advised to keep panels dry until removal in 2 days for first read. All questions and concerns answered.

## 2022-09-12 ENCOUNTER — Ambulatory Visit (INDEPENDENT_AMBULATORY_CARE_PROVIDER_SITE_OTHER): Payer: Medicare Other

## 2022-09-12 ENCOUNTER — Other Ambulatory Visit: Payer: Self-pay

## 2022-09-12 DIAGNOSIS — R21 Rash and other nonspecific skin eruption: Secondary | ICD-10-CM | POA: Diagnosis not present

## 2022-09-12 DIAGNOSIS — E782 Mixed hyperlipidemia: Secondary | ICD-10-CM

## 2022-09-12 DIAGNOSIS — I10 Essential (primary) hypertension: Secondary | ICD-10-CM

## 2022-09-12 DIAGNOSIS — F419 Anxiety disorder, unspecified: Secondary | ICD-10-CM

## 2022-09-12 MED ORDER — AMLODIPINE BESYLATE 5 MG PO TABS: 5.0000 mg | ORAL_TABLET | Freq: Every day | ORAL | 3 refills | Status: DC

## 2022-09-12 MED ORDER — FLUOXETINE HCL 40 MG PO CAPS: 40.0000 mg | ORAL_CAPSULE | Freq: Every day | ORAL | 3 refills | Status: DC

## 2022-09-12 MED ORDER — ATORVASTATIN CALCIUM 20 MG PO TABS: 20.0000 mg | ORAL_TABLET | Freq: Every day | ORAL | 3 refills | Status: DC

## 2022-09-12 MED ORDER — BUSPIRONE HCL 10 MG PO TABS: 10.0000 mg | ORAL_TABLET | Freq: Two times a day (BID) | ORAL | 3 refills | Status: DC

## 2022-09-12 NOTE — Progress Notes (Signed)
Patient here today for day 3 of True Test 36 patch testing reading. Patient shows no visible reaction at this time. Patient advised on how to read test. He stated that his wife would be helping him read each day. Encouraged patient to touch up markings on testing site if they become faded. Patient advised that showering is okay, but still to avoid scrubbing and soaking the testing site until final read on Monday.  Photo taken and in under media tab.

## 2022-09-17 ENCOUNTER — Ambulatory Visit: Payer: Medicare Other | Admitting: Dermatology

## 2022-09-17 VITALS — BP 121/77 | HR 59

## 2022-09-17 DIAGNOSIS — L739 Follicular disorder, unspecified: Secondary | ICD-10-CM | POA: Diagnosis not present

## 2022-09-17 DIAGNOSIS — L209 Atopic dermatitis, unspecified: Secondary | ICD-10-CM | POA: Diagnosis not present

## 2022-09-17 MED ORDER — KETOCONAZOLE 2 % EX SHAM: MEDICATED_SHAMPOO | CUTANEOUS | 2 refills | Status: DC

## 2022-09-17 MED ORDER — CLINDAMYCIN PHOSPHATE 1 % EX LOTN: TOPICAL_LOTION | CUTANEOUS | 2 refills | Status: DC

## 2022-09-17 MED ORDER — DOXYCYCLINE MONOHYDRATE 100 MG PO CAPS: 100.0000 mg | ORAL_CAPSULE | Freq: Every day | ORAL | 2 refills | Status: DC

## 2022-09-17 NOTE — Progress Notes (Deleted)
   Follow-Up Visit   Subjective  Spencer Smith is a 67 y.o. male who presents for the following: 1 week follow-up TRUE Test Patch testing.    The following portions of the chart were reviewed this encounter and updated as appropriate: medications, allergies, medical history  Review of Systems:  No other skin or systemic complaints except as noted in HPI or Assessment and Plan.  Objective  Well appearing patient in no apparent distress; mood and affect are within normal limits.  ***A full examination was performed including scalp, head, eyes, ears, nose, lips, neck, chest, axillae, abdomen, back, buttocks, bilateral upper extremities, bilateral lower extremities, hands, feet, fingers, toes, fingernails, and toenails. All findings within normal limits unless otherwise noted below.  ***A focused examination was performed of the following areas: ***    Assessment & Plan      No follow-ups on file.  ***  Documentation: I have reviewed the above documentation for accuracy and completeness, and I agree with the above.  Brendolyn Patty, MD

## 2022-09-17 NOTE — Progress Notes (Signed)
   Follow-Up Visit   Subjective  Spencer Smith is a 67 y.o. male who presents for the following: 7 day TRUE Test Patch test follow-up. Patient had some itching on his upper back after patches were removed, but didn't notice any specific reactions inside a square. He started tacrolimus ointment, which has helped some. He also uses Clobetasol/CeraVe Cream mix as needed. Still gets itchy areas, mainly on trunk.   The following portions of the chart were reviewed this encounter and updated as appropriate: medications, allergies, medical history  Review of Systems:  No other skin or systemic complaints except as noted in HPI or Assessment and Plan.  Objective  Well appearing patient in no apparent distress; mood and affect are within normal limits.  A focused examination was performed of the following areas: Face, back, abdomen, arms.    Assessment & Plan  ATOPIC DERMATITIS VS NUMMULAR DERMATITIS Exam: Pink papules some with excoriations on the upper back; excoriations on the lower abdomen. No reactions on back to patch test today. Tru test x 36 is negative  Chronic and persistent condition with duration or expected duration over one year. Condition is symptomatic/ bothersome to patient. Not currently at goal.   Atopic dermatitis (eczema) is a chronic, relapsing, pruritic condition that can significantly affect quality of life. It is often associated with allergic rhinitis and/or asthma and can require treatment with topical medications, phototherapy, or in severe cases biologic injectable medication (Dupixent; Adbry) or Oral JAK inhibitors.  Treatment Plan: Continue tacrolimus ointment QD/BID prn.  Continue Clobetasol/CeraVe mix QD/BID prn. Avoid face, groin, axilla. Topical steroids (such as triamcinolone, fluocinolone, fluocinonide, mometasone, clobetasol, halobetasol, betamethasone, hydrocortisone) can cause thinning and lightening of the skin if they are used for too long in the  same area. Your physician has selected the right strength medicine for your problem and area affected on the body. Please use your medication only as directed by your physician to prevent side effects.   Discussed Dupixent injections. Patient prefers to continue with topicals during the summer to see if he improves. Will consider if no improvement.   Recommend gentle skin care.   FOLLICULITIS Possible worsening with use of topical emollients for eczema Exam: Perifollicular erythematous papules and pustules on the back and chest. Pt c/o itchy bumps in scalp, but not there today. Treatment Plan: Start doxycycline 100 MG take 1 po QD with food dsp #30 2Rf. Doxycycline should be taken with food to prevent nausea. Do not lay down for 30 minutes after taking. Be cautious with sun exposure and use good sun protection while on this medication. Pregnant women should not take this medication.   Start Clindamycin lotion Apply to shoulders, chest, back QD after shower 75mL 2Rf.  Start 2% Ketoconazole shampoo - massage into scalp and let sit several minutes before rinsing dsp 138mL 2Rf.  Return in about 3 months (around 12/18/2022) for eczema.  IJamesetta Orleans, CMA, am acting as scribe for Brendolyn Patty, MD .   Documentation: I have reviewed the above documentation for accuracy and completeness, and I agree with the above.  Brendolyn Patty, MD

## 2022-09-17 NOTE — Patient Instructions (Addendum)
Doxycycline should be taken with food to prevent nausea. Do not lay down for 30 minutes after taking. Be cautious with sun exposure and use good sun protection while on this medication. Pregnant women should not take this medication.   Topical steroids (such as triamcinolone, fluocinolone, fluocinonide, mometasone, clobetasol, halobetasol, betamethasone, hydrocortisone) can cause thinning and lightening of the skin if they are used for too long in the same area. Your physician has selected the right strength medicine for your problem and area affected on the body. Please use your medication only as directed by your physician to prevent side effects.    Due to recent changes in healthcare laws, you may see results of your pathology and/or laboratory studies on MyChart before the doctors have had a chance to review them. We understand that in some cases there may be results that are confusing or concerning to you. Please understand that not all results are received at the same time and often the doctors may need to interpret multiple results in order to provide you with the best plan of care or course of treatment. Therefore, we ask that you please give [place] 2 business days to thoroughly review all your results before contacting the office for clarification. Should we see a critical lab result, you will be contacted sooner.   If You Need Anything After Your Visit  If you have any questions or concerns for your doctor, please call our main line at [phone] and press option 4 to reach your doctor's medical assistant. If no one answers, please leave a voicemail as directed and we will return your call as soon as possible. Messages left after 4 pm will be answered the following business day.   You may also send [place] a message via [place]. We typically respond to MyChart messages within 1-2 business days.  For prescription refills, please ask your pharmacy to contact our office. Our fax number is  [phone].  If you have an urgent issue when the clinic is closed that cannot wait until the next business day, you can page your doctor at the number below.    Please note that while we do our best to be available for urgent issues outside of office hours, we are not available 24/7.   If you have an urgent issue and are unable to reach [place], you may choose to seek medical care at your doctor's office, retail clinic, urgent care center, or emergency room.  If you have a medical emergency, please immediately call 911 or go to the emergency department.  Pager Numbers  - Dr. Nehemiah Massed: [phone]  - Dr. Laurence Ferrari: [phone]  - Dr. Nicole Kindred: [phone]  In the event of inclement weather, please call our main line at [phone] for an update on the status of any delays or closures.  Dermatology Medication Tips: Please keep the boxes that topical medications come in in order to help keep track of the instructions about where and how to use these. Pharmacies typically print the medication instructions only on the boxes and not directly on the medication tubes.   If your medication is too expensive, please contact our office at [phone] option 4 or send [place] a message through [place].   We are unable to tell what your co-pay for medications will be in advance as this is different depending on your insurance coverage. However, we may be able to find a substitute medication at lower cost or fill out paperwork to get insurance to cover a needed medication.  If a prior authorization is required to get your medication covered by your insurance company, please allow [place] 1-2 business days to complete this process.  Drug prices often vary depending on where the prescription is filled and some pharmacies may offer cheaper prices.  The website www.goodrx.com contains coupons for medications through different pharmacies. The prices here do not account for what the cost may be with help from  insurance (it may be cheaper with your insurance), but the website can give you the price if you did not use any insurance.  - You can print the associated coupon and take it with your prescription to the pharmacy.  - You may also stop by our office during regular business hours and pick up a GoodRx coupon card.  - If you need your prescription sent electronically to a different pharmacy, notify our office through [facility] or by phone at [phone] option 4.     Si Usted Necesita Algo Despus de Su Visita  Tambin puede enviarnos un mensaje a travs de Pharmacist, community. Por lo general respondemos a los mensajes de MyChart en el transcurso de 1 a 2 das hbiles.  Para renovar recetas, por favor pida a su farmacia que se ponga en contacto con nuestra oficina. Harland Dingwall de fax es [place] [phone].  Si tiene un asunto urgente cuando la clnica est cerrada y que no puede esperar hasta el siguiente da hbil, puede llamar/localizar a su doctor(a) al nmero que aparece a continuacin.   Por favor, tenga en cuenta que aunque hacemos todo lo posible para estar disponibles para asuntos urgentes fuera del horario de [place], no estamos disponibles las 24 horas del da, los 7 das de la [place].   Si tiene un problema urgente y no puede comunicarse con nosotros, puede optar por buscar atencin mdica  en el consultorio de su doctor(a), en una clnica privada, en un centro de atencin urgente o en una sala de emergencias.  Si tiene Engineering geologist, por favor llame inmediatamente al 911 o vaya a la sala de emergencias.  Nmeros de bper  - Dr. Nehemiah Massed: [phone]  - Dra. Moye: [phone]  - Dra. Nicole Kindred: [phone]  En caso de inclemencias del [place], por favor llame a Johnsie Kindred principal al [phone] para una actualizacin sobre el [place] de cualquier retraso o cierre.  Consejos para la medicacin en dermatologa: Por favor, guarde las cajas en las que vienen los  medicamentos de uso tpico para ayudarle a seguir las instrucciones sobre dnde y cmo usarlos. Las farmacias generalmente imprimen las instrucciones del medicamento slo en las cajas y no directamente en los tubos del [place].   Si su medicamento es muy caro, por favor, pngase en contacto con Zigmund Daniel llamando al [phone] y presione la opcin 4 o envenos un mensaje a travs de Pharmacist, community.   No podemos decirle cul ser su copago por los medicamentos por adelantado ya que esto es diferente dependiendo de la cobertura de su seguro. Sin embargo, es posible que podamos encontrar un medicamento sustituto a Electrical engineer un formulario para que el seguro cubra el medicamento que se considera necesario.   Si se requiere una autorizacin previa para que su compaa de seguros [place] su medicamento, por favor permtanos de 1 a 2 das hbiles para completar este proceso.  Los precios de los medicamentos varan con frecuencia dependiendo del Environmental consultant de dnde se surte la receta y alguna farmacias pueden ofrecer precios ms baratos.  El sitio web www.goodrx.com  de diferentes farmacias. Los precios aqu no tienen en cuenta lo que podra costar con la ayuda del seguro (puede ser ms barato con su seguro), pero el sitio web puede darle el precio si no utiliz ningn seguro.  - Puede imprimir el cupn correspondiente y llevarlo con su receta a la farmacia.  - Tambin puede pasar por nuestra oficina durante el horario de atencin regular y recoger una tarjeta de cupones de GoodRx.  - Si necesita que su receta se enve electrnicamente a una farmacia diferente, informe a nuestra oficina a travs de MyChart de [place] o por telfono llamando al [phone] y presione la opcin 4.  

## 2022-10-03 DIAGNOSIS — Z0000 Encounter for general adult medical examination without abnormal findings: Secondary | ICD-10-CM | POA: Diagnosis not present

## 2022-12-18 ENCOUNTER — Ambulatory Visit: Payer: Medicare Other | Admitting: Dermatology

## 2023-01-04 ENCOUNTER — Ambulatory Visit (INDEPENDENT_AMBULATORY_CARE_PROVIDER_SITE_OTHER): Payer: Medicare Other | Admitting: Internal Medicine

## 2023-01-04 ENCOUNTER — Encounter: Payer: Self-pay | Admitting: Internal Medicine

## 2023-01-04 VITALS — BP 128/84 | HR 52 | Ht 73.0 in | Wt 221.6 lb

## 2023-01-04 DIAGNOSIS — I10 Essential (primary) hypertension: Secondary | ICD-10-CM

## 2023-01-04 DIAGNOSIS — E782 Mixed hyperlipidemia: Secondary | ICD-10-CM

## 2023-01-04 DIAGNOSIS — N400 Enlarged prostate without lower urinary tract symptoms: Secondary | ICD-10-CM

## 2023-01-04 DIAGNOSIS — G4733 Obstructive sleep apnea (adult) (pediatric): Secondary | ICD-10-CM | POA: Diagnosis not present

## 2023-01-04 HISTORY — DX: Benign prostatic hyperplasia without lower urinary tract symptoms: N40.0

## 2023-01-04 NOTE — Progress Notes (Signed)
Established Patient Office Visit  Subjective:  Patient ID: Spencer Smith, male    DOB: 1955-12-26  Age: 67 y.o. MRN: [id number]  Chief Complaint  Patient presents with   Follow-up    6 month follow up    Patient comes in for his follow-up today.  He has been doing well since his last visit.  He reports that he has been trying to exercise and control his diet but only lost 1 pound.  However he feels well.  Patient has been using CPAP machine for several years for his obstructive sleep apnea.   He needs a new machine now and requests a prescription to be sent to [facility]. Patient is fasting for lab work today.    No other concerns at this time.   Past Medical History:  Diagnosis Date   Anxiety    History of kidney stones    Hypercholesterolemia    Hypertension    Sleep apnea     Past Surgical History:  Procedure Laterality Date   COLONOSCOPY  2011   PROSTATE BIOPSY N/A 05/26/2020   Procedure: PROSTATE BIOPSY Addison Bailey;  Surgeon: Orson Ape, MD;  Location: [facility] ORS;  Service: Urology;  Laterality: N/A;   TONSILLECTOMY  1963    Social History   Socioeconomic History   Marital status: Married    Spouse name: Victorino Dike   Number of children: Not on file   Years of education: Not on file   Highest education level: Not on file  Occupational History   Occupation: retired  Tobacco Use   Smoking status: Former    Current packs/day: 0.00    Types: Cigarettes    Quit date: 2019    Years since quitting: 5.5   Smokeless tobacco: Never  Vaping Use   Vaping status: Never Used  Substance and Sexual Activity   Alcohol use: Not Currently    Comment: none since 2019   Drug use: Not Currently    Types: Marijuana    Comment: several years ago   Sexual activity: Not on file  Other Topics Concern   Not on file  Social History Narrative   Not on file   Social Determinants of Health   Financial Resource Strain: Not on file  Food Insecurity: No Food Insecurity (09/05/2022)    Hunger Vital Sign    Worried About Running Out of Food in the Last Year: Never true    Ran Out of Food in the Last Year: Never true  Transportation Needs: No Transportation Needs (09/05/2022)   PRAPARE - Administrator, Civil Service (Medical): No    Lack of Transportation (Non-Medical): No  Physical Activity: Not on file  Stress: Not on file  Social Connections: Not on file  Intimate Partner Violence: Not on file    Family History  Problem Relation Age of Onset   Heart disease Mother    Dementia Father     No Known Allergies  Review of Systems  Constitutional: Negative.  Negative for chills, fever, malaise/fatigue and weight loss.  HENT: Negative.  Negative for hearing loss, sinus pain and sore throat.   Eyes: Negative.   Respiratory: Negative.  Negative for cough, shortness of breath, wheezing and stridor.   Cardiovascular: Negative.  Negative for chest pain, palpitations and leg swelling.  Gastrointestinal: Negative.  Negative for abdominal pain, constipation, diarrhea, heartburn, nausea and vomiting.  Genitourinary: Negative.  Negative for dysuria and flank pain.  Musculoskeletal: Negative.  Negative for joint pain and  myalgias.  Skin: Negative.   Neurological: Negative.  Negative for dizziness and headaches.  Endo/Heme/Allergies: Negative.   Psychiatric/Behavioral: Negative.  Negative for depression and suicidal ideas. The patient is not nervous/anxious.        Objective:   BP 128/84   Pulse (!) 52   Ht 6\' 1"  (1.854 m)   Wt 221 lb 9.6 oz (100.5 kg)   SpO2 95%   BMI 29.24 kg/m   Vitals:   01/04/23 0901  BP: 128/84  Pulse: (!) 52  Height: 6\' 1"  (1.854 m)  Weight: 221 lb 9.6 oz (100.5 kg)  SpO2: 95%  BMI (Calculated): 29.24    Physical Exam Vitals and nursing note reviewed.  Constitutional:      Appearance: Normal appearance.  HENT:     Head: Normocephalic and atraumatic.     Nose: Nose normal.     Mouth/Throat:     Mouth: Mucous membranes  are moist.     Pharynx: Oropharynx is clear.  Eyes:     Conjunctiva/sclera: Conjunctivae normal.     Pupils: Pupils are equal, round, and reactive to light.  Cardiovascular:     Rate and Rhythm: Normal rate and regular rhythm.     Pulses: Normal pulses.     Heart sounds: Normal heart sounds.  Pulmonary:     Effort: Pulmonary effort is normal.     Breath sounds: Normal breath sounds. No wheezing, rhonchi or rales.  Abdominal:     General: Bowel sounds are normal. There is no distension.     Palpations: Abdomen is soft. There is no mass.     Tenderness: There is no abdominal tenderness.  Musculoskeletal:        General: Normal range of motion.     Cervical back: Normal range of motion.  Skin:    General: Skin is warm and dry.  Neurological:     General: No focal deficit present.     Mental Status: He is alert and oriented to person, place, and time.  Psychiatric:        Mood and Affect: Mood normal.        Behavior: Behavior normal.        Judgment: Judgment normal.      No results found for any visits on 01/04/23.  No results found for this or any previous visit (from the past 2160 hour(s)).    Assessment & Plan:  Patient to continue taking his current meds.  Check labs today.  Needs new CPAP equipment, prescription sent. Problem List Items Addressed This Visit     Essential hypertension, benign - Primary   Relevant Orders   CMP14+EGFR   CBC With Differential   OSA on CPAP   Relevant Orders   For home use only DME continuous positive airway pressure (CPAP)   Mixed hyperlipidemia   Relevant Orders   Lipid Panel w/o Chol/HDL Ratio   Benign prostatic hyperplasia without lower urinary tract symptoms   Relevant Orders   PSA    Return in about 6 months (around 07/07/2023).   Total time spent: 30 minutes  Margaretann Loveless, MD  01/04/2023   This document may have been prepared by [facility] Voice Recognition software and as such may include unintentional dictation  errors.

## 2023-01-05 LAB — CBC WITH DIFFERENTIAL
Basophils Absolute: 0.1 10*3/uL (ref 0.0–0.2)
Basos: 1 %
EOS (ABSOLUTE): 0.2 10*3/uL (ref 0.0–0.4)
Eos: 3 %
Hematocrit: 42.6 % (ref 37.5–51.0)
Hemoglobin: 14.5 g/dL (ref 13.0–17.7)
Immature Grans (Abs): 0 10*3/uL (ref 0.0–0.1)
Immature Granulocytes: 0 %
Lymphocytes Absolute: 1.4 10*3/uL (ref 0.7–3.1)
Lymphs: 25 %
MCH: 31 pg (ref 26.6–33.0)
MCHC: 34 g/dL (ref 31.5–35.7)
MCV: 91 fL (ref 79–97)
Monocytes Absolute: 0.5 10*3/uL (ref 0.1–0.9)
Monocytes: 9 %
Neutrophils Absolute: 3.5 10*3/uL (ref 1.4–7.0)
Neutrophils: 62 %
RBC: 4.68 x10E6/uL (ref 4.14–5.80)
RDW: 12.2 % (ref 11.6–15.4)
WBC: 5.7 10*3/uL (ref 3.4–10.8)

## 2023-01-05 LAB — LIPID PANEL W/O CHOL/HDL RATIO
Cholesterol, Total: 142 mg/dL (ref 100–199)
HDL: 56 mg/dL (ref >39)
LDL Chol Calc (NIH): 69 mg/dL (ref 0–99)
Triglycerides: 89 mg/dL (ref 0–149)
VLDL Cholesterol Cal: 17 mg/dL (ref 5–40)

## 2023-01-05 LAB — CMP14+EGFR
ALT: 23 IU/L (ref 0–44)
AST: 18 IU/L (ref 0–40)
Albumin: 4.6 g/dL (ref 3.9–4.9)
Alkaline Phosphatase: 123 IU/L — ABNORMAL HIGH (ref 44–121)
BUN/Creatinine Ratio: 15 (ref 10–24)
BUN: 13 mg/dL (ref 8–27)
Bilirubin Total: 0.6 mg/dL (ref 0.0–1.2)
CO2: 22 mmol/L (ref 20–29)
Calcium: 9.5 mg/dL (ref 8.6–10.2)
Chloride: 103 mmol/L (ref 96–106)
Creatinine, Ser: 0.86 mg/dL (ref 0.76–1.27)
Globulin, Total: 2 g/dL (ref 1.5–4.5)
Glucose: 85 mg/dL (ref 70–99)
Potassium: 5 mmol/L (ref 3.5–5.2)
Sodium: 140 mmol/L (ref 134–144)
Total Protein: 6.6 g/dL (ref 6.0–8.5)
eGFR: 95 mL/min/{1.73_m2} (ref >59)

## 2023-01-05 LAB — PSA: Prostate Specific Ag, Serum: 2.8 ng/mL (ref 0.0–4.0)

## 2023-01-07 ENCOUNTER — Other Ambulatory Visit: Payer: Self-pay | Admitting: Internal Medicine

## 2023-01-07 DIAGNOSIS — R748 Abnormal levels of other serum enzymes: Secondary | ICD-10-CM

## 2023-01-07 NOTE — Progress Notes (Signed)
Patient notified

## 2023-01-17 ENCOUNTER — Other Ambulatory Visit: Payer: Medicare Other

## 2023-01-17 DIAGNOSIS — R748 Abnormal levels of other serum enzymes: Secondary | ICD-10-CM

## 2023-01-18 LAB — HEPATIC FUNCTION PANEL: ALT: 18 IU/L (ref 0–44)

## 2023-01-18 NOTE — Progress Notes (Signed)
Spoke with pt who verbalized understanding.

## 2023-02-28 DIAGNOSIS — L308 Other specified dermatitis: Secondary | ICD-10-CM | POA: Diagnosis not present

## 2023-05-23 ENCOUNTER — Other Ambulatory Visit: Payer: Self-pay | Admitting: Internal Medicine

## 2023-05-23 DIAGNOSIS — E782 Mixed hyperlipidemia: Secondary | ICD-10-CM

## 2023-06-04 DIAGNOSIS — H2513 Age-related nuclear cataract, bilateral: Secondary | ICD-10-CM | POA: Diagnosis not present

## 2023-06-04 DIAGNOSIS — H5021 Vertical strabismus, right eye: Secondary | ICD-10-CM | POA: Diagnosis not present

## 2023-06-04 DIAGNOSIS — I10 Essential (primary) hypertension: Secondary | ICD-10-CM | POA: Diagnosis not present

## 2023-06-26 DIAGNOSIS — N329 Bladder disorder, unspecified: Secondary | ICD-10-CM

## 2023-06-26 HISTORY — DX: Bladder disorder, unspecified: N32.9

## 2023-07-08 ENCOUNTER — Encounter: Payer: Self-pay | Admitting: Internal Medicine

## 2023-07-08 ENCOUNTER — Ambulatory Visit (INDEPENDENT_AMBULATORY_CARE_PROVIDER_SITE_OTHER): Payer: Medicare Other | Admitting: Internal Medicine

## 2023-07-08 ENCOUNTER — Other Ambulatory Visit: Payer: Self-pay

## 2023-07-08 VITALS — BP 122/72 | HR 52 | Ht 72.0 in | Wt 215.8 lb

## 2023-07-08 DIAGNOSIS — F419 Anxiety disorder, unspecified: Secondary | ICD-10-CM

## 2023-07-08 DIAGNOSIS — Z91018 Allergy to other foods: Secondary | ICD-10-CM | POA: Diagnosis not present

## 2023-07-08 DIAGNOSIS — I10 Essential (primary) hypertension: Secondary | ICD-10-CM | POA: Diagnosis not present

## 2023-07-08 DIAGNOSIS — E782 Mixed hyperlipidemia: Secondary | ICD-10-CM

## 2023-07-08 DIAGNOSIS — Z0000 Encounter for general adult medical examination without abnormal findings: Secondary | ICD-10-CM | POA: Diagnosis not present

## 2023-07-08 DIAGNOSIS — G4733 Obstructive sleep apnea (adult) (pediatric): Secondary | ICD-10-CM | POA: Diagnosis not present

## 2023-07-08 NOTE — Progress Notes (Signed)
 Established Patient Office Visit  Subjective:  Patient ID: Spencer Smith, male    DOB: 1955-07-14  Age: 68 y.o. MRN: [id number]  Chief Complaint  Patient presents with   Follow-up    6 month follow up    Patient comes in for his 64-month follow-up and wellness visit.  He is generally feeling very well and has lost some weight.  Reports of being diagnosed with alpha gal allergies and has stopped eating meat which has helped him to lose weight as well as his itchy rash has disappeared.  He has no other complaints. Last colonoscopy was 2022. PHQ-9/GAD score is 0/0.  He is currently on Prozac  and tolerating it well. CFS score is 0. Check labs today.    No other concerns at this time.   Past Medical History:  Diagnosis Date   Anxiety    History of kidney stones    Hypercholesterolemia    Hypertension    Sleep apnea     Past Surgical History:  Procedure Laterality Date   COLONOSCOPY  2011   PROSTATE BIOPSY N/A 05/26/2020   Procedure: PROSTATE BIOPSY GRAYCE;  Surgeon: Kassie Ozell SAUNDERS, MD;  Location: [facility] OR[facility];  Service: Urology;  Laterality: N/A;   TONSILLECTOMY  1963    Social History   Socioeconomic History   Marital status: Married    Spouse name: Delon   Number of children: Not on file   Years of education: Not on file   Highest education level: Not on file  Occupational History   Occupation: retired  Tobacco Use   Smoking status: Former    Current packs/day: 0.00    Types: Cigarettes    Quit date: 2019    Years since quitting: 6.0   Smokeless tobacco: Never  Vaping Use   Vaping status: Never Used  Substance and Sexual Activity   Alcohol use: Not Currently    Comment: none since 2019   Drug use: Not Currently    Types: Marijuana    Comment: several years ago   Sexual activity: Not on file  Other Topics Concern   Not on file  Social History Narrative   Not on file   Social Drivers of Health   Financial Resource Strain: Not on file  Food  Insecurity: No Food Insecurity (07/08/2023)   Hunger Vital Sign    Worried About Running Out of Food in the Last Year: Never true    Ran Out of Food in the Last Year: Never true  Transportation Needs: No Transportation Needs (09/05/2022)   PRAPARE - Administrator, Civil Service (Medical): No    Lack of Transportation (Non-Medical): No  Physical Activity: Not on file  Stress: No Stress Concern Present (07/08/2023)   [employer] of Occupational Health - Occupational Stress Questionnaire    Feeling of Stress : Not at all  Social Connections: Not on file  Intimate Partner Violence: Not At Risk (07/08/2023)   Humiliation, Afraid, Rape, and Kick questionnaire    Fear of Current or Ex-Partner: No    Emotionally Abused: No    Physically Abused: No    Sexually Abused: No    Family History  Problem Relation Age of Onset   Heart disease Mother    Dementia Father     No Known Allergies  Outpatient Medications Prior to Visit  Medication Sig   amLODipine  (NORVASC ) 5 MG tablet Take 1 tablet (5 mg total) by mouth daily.   aspirin  EC 81 MG tablet  Take 81 mg by mouth daily. Swallow whole.   atorvastatin  (LIPITOR) 20 MG tablet TAKE 1 TABLET BY MOUTH DAILY   busPIRone  (BUSPAR ) 10 MG tablet Take 1 tablet (10 mg total) by mouth 2 (two) times daily.   FLUoxetine  (PROZAC ) 40 MG capsule Take 1 capsule (40 mg total) by mouth daily.   Multiple Vitamin (MULTIVITAMIN WITH MINERALS) TABS tablet Take 1 tablet by mouth daily.   cholecalciferol (VITAMIN D3) 25 MCG (1000 UNIT) tablet Take 1,000 Units by mouth daily. (Patient not taking: Reported on 07/08/2023)   clindamycin  (CLEOCIN -T) 1 % lotion Apply to chest, shoulders, back once daily after shower. (Patient not taking: Reported on 07/08/2023)   clobetasol  (TEMOVATE ) 0.05 % external solution Use as directed. Refer to patient handout. Avoid applying to face, groin, and axilla. Use as directed. (Patient not taking: Reported on 07/08/2023)    doxycycline  (MONODOX ) 100 MG capsule Take 1 capsule (100 mg total) by mouth daily. Take with food. (Patient not taking: Reported on 07/08/2023)   hydrOXYzine  (ATARAX ) 25 MG tablet Take 1 tablet (25 mg total) by mouth every 6 (six) hours. (Patient not taking: Reported on 01/04/2023)   ketoconazole  (NIZORAL ) 2 % shampoo Massage into scalp daily, let sit several minutes before rinsing. (Patient not taking: Reported on 07/08/2023)   metroNIDAZOLE  (METROGEL ) 1 % gel Apply QHS to affected areas on face. (Patient not taking: Reported on 07/08/2023)   naproxen  (NAPROSYN ) 500 MG tablet Take 1 tablet (500 mg total) by mouth 2 (two) times daily. (Patient not taking: Reported on 07/08/2023)   Omega-3 Fatty Acids (FISH OIL) 1000 MG CAPS Take 1,000 mg by mouth daily. (Patient not taking: Reported on 07/08/2023)   tacrolimus  (PROTOPIC ) 0.1 % ointment Apply to AA rash QD/BID prn. (Patient not taking: Reported on 07/08/2023)   No facility-administered medications prior to visit.    Review of Systems  Constitutional: Negative.  Negative for chills, diaphoresis, fever and malaise/fatigue.  HENT: Negative.  Negative for congestion, hearing loss, sinus pain and sore throat.   Eyes: Negative.   Respiratory: Negative.  Negative for cough, shortness of breath and stridor.   Cardiovascular: Negative.  Negative for chest pain, palpitations and leg swelling.  Gastrointestinal: Negative.  Negative for abdominal pain, constipation, diarrhea, heartburn, nausea and vomiting.  Genitourinary: Negative.  Negative for dysuria and flank pain.  Musculoskeletal: Negative.  Negative for joint pain and myalgias.  Skin: Negative.   Neurological: Negative.  Negative for dizziness, tingling, tremors, sensory change and headaches.  Endo/Heme/Allergies: Negative.   Psychiatric/Behavioral: Negative.  Negative for depression and suicidal ideas. The patient is not nervous/anxious.        Objective:   BP 122/72   Pulse (!) 52   Ht 6'  (1.829 m)   Wt 215 lb 12.8 oz (97.9 kg)   SpO2 98%   BMI 29.27 kg/m   Vitals:   07/08/23 0856  BP: 122/72  Pulse: (!) 52  Height: 6' (1.829 m)  Weight: 215 lb 12.8 oz (97.9 kg)  SpO2: 98%  BMI (Calculated): 29.26    Physical Exam Vitals and nursing note reviewed.  Constitutional:      Appearance: Normal appearance.  HENT:     Head: Normocephalic and atraumatic.     Nose: Nose normal.     Mouth/Throat:     Mouth: Mucous membranes are moist.     Pharynx: Oropharynx is clear.  Eyes:     Conjunctiva/sclera: Conjunctivae normal.     Pupils: Pupils are equal, round, and reactive  to light.  Cardiovascular:     Rate and Rhythm: Normal rate and regular rhythm.     Pulses: Normal pulses.     Heart sounds: Normal heart sounds.  Pulmonary:     Effort: Pulmonary effort is normal.     Breath sounds: Normal breath sounds.  Abdominal:     General: Bowel sounds are normal.     Palpations: Abdomen is soft.  Musculoskeletal:        General: Normal range of motion.     Cervical back: Normal range of motion.  Skin:    General: Skin is warm and dry.  Neurological:     General: No focal deficit present.     Mental Status: He is alert and oriented to person, place, and time.  Psychiatric:        Mood and Affect: Mood normal.        Behavior: Behavior normal.        Judgment: Judgment normal.      No results found for any visits on 07/08/23.  No results found for this or any previous visit (from the past 2160 hours).    Assessment & Plan:  Continue all medications as such. Problem List Items Addressed This Visit     Anxiety   Essential hypertension, benign   Relevant Orders   CMP14+EGFR   OSA on CPAP   Mixed hyperlipidemia   Relevant Orders   Lipid Panel w/o Chol/HDL Ratio   Allergy to alpha-gal   Other Visit Diagnoses       Medicare annual wellness visit, subsequent    -  Primary       Return in about 6 months (around 01/05/2024).   Total time spent: 30  minutes  FERNAND FREDY RAMAN, MD  07/08/2023   This document may have been prepared by [facility] Voice Recognition software and as such may include unintentional dictation errors.

## 2023-07-09 LAB — CMP14+EGFR
ALT: 25 [IU]/L (ref 0–44)
AST: 23 [IU]/L (ref 0–40)
Albumin: 4.4 g/dL (ref 3.9–4.9)
Alkaline Phosphatase: 133 [IU]/L — ABNORMAL HIGH (ref 44–121)
BUN/Creatinine Ratio: 15 (ref 10–24)
BUN: 12 mg/dL (ref 8–27)
Bilirubin Total: 0.5 mg/dL (ref 0.0–1.2)
CO2: 21 mmol/L (ref 20–29)
Calcium: 9.3 mg/dL (ref 8.6–10.2)
Chloride: 102 mmol/L (ref 96–106)
Creatinine, Ser: 0.8 mg/dL (ref 0.76–1.27)
Globulin, Total: 1.8 g/dL (ref 1.5–4.5)
Glucose: 82 mg/dL (ref 70–99)
Potassium: 4.5 mmol/L (ref 3.5–5.2)
Sodium: 139 mmol/L (ref 134–144)
Total Protein: 6.2 g/dL (ref 6.0–8.5)
eGFR: 96 mL/min/{1.73_m2} (ref >59)

## 2023-07-09 LAB — LIPID PANEL W/O CHOL/HDL RATIO
Cholesterol, Total: 137 mg/dL (ref 100–199)
HDL: 59 mg/dL (ref >39)
LDL Chol Calc (NIH): 60 mg/dL (ref 0–99)
Triglycerides: 97 mg/dL (ref 0–149)
VLDL Cholesterol Cal: 18 mg/dL (ref 5–40)

## 2023-07-09 NOTE — Progress Notes (Signed)
 Patient notified

## 2023-07-10 ENCOUNTER — Encounter: Payer: Self-pay | Admitting: Internal Medicine

## 2023-07-15 ENCOUNTER — Other Ambulatory Visit: Payer: Self-pay | Admitting: Internal Medicine

## 2023-07-15 ENCOUNTER — Encounter: Payer: Self-pay | Admitting: Internal Medicine

## 2023-07-15 DIAGNOSIS — R361 Hematospermia: Secondary | ICD-10-CM

## 2023-07-27 ENCOUNTER — Other Ambulatory Visit: Payer: Self-pay | Admitting: Internal Medicine

## 2023-07-27 DIAGNOSIS — F419 Anxiety disorder, unspecified: Secondary | ICD-10-CM

## 2023-07-27 DIAGNOSIS — I10 Essential (primary) hypertension: Secondary | ICD-10-CM

## 2023-07-28 ENCOUNTER — Ambulatory Visit: Payer: Self-pay

## 2023-07-28 ENCOUNTER — Ambulatory Visit (INDEPENDENT_AMBULATORY_CARE_PROVIDER_SITE_OTHER): Payer: Medicare Other

## 2023-07-28 ENCOUNTER — Ambulatory Visit
Admission: RE | Admit: 2023-07-28 | Discharge: 2023-07-28 | Disposition: A | Discharge status: Home / Self Care | Payer: Medicare Other | Source: Ambulatory Visit | Attending: Emergency Medicine | Admitting: Emergency Medicine

## 2023-07-28 VITALS — BP 118/80 | HR 56 | Temp 97.6°F | Resp 15 | Ht 72.0 in | Wt 212.0 lb

## 2023-07-28 DIAGNOSIS — S62641A Nondisplaced fracture of proximal phalanx of left index finger, initial encounter for closed fracture: Secondary | ICD-10-CM

## 2023-07-28 DIAGNOSIS — M1812 Unilateral primary osteoarthritis of first carpometacarpal joint, left hand: Secondary | ICD-10-CM | POA: Diagnosis not present

## 2023-07-28 MED ORDER — IBUPROFEN 600 MG PO TABS: 600.0000 mg | ORAL_TABLET | Freq: Three times a day (TID) | ORAL | 0 refills | Status: DC | PRN

## 2023-07-28 NOTE — ED Triage Notes (Signed)
Patient states that he jammed his left 2nd finger on Tuesday.  Patient reports swelling and ongoing pain.

## 2023-07-28 NOTE — Discharge Instructions (Signed)
You have a small nondisplaced fracture at the base of the proximal phalanx.  This should heal well on its own, but I would follow-up with [facility] in [place] or [place] within the week.  Take 600 mg of ibuprofen combined with 1000 mg Tylenol 3-4 times a day as needed for pain, ice the area as needed.

## 2023-07-28 NOTE — ED Provider Notes (Signed)
HPI  SUBJECTIVE:  Spencer Smith is a right-handed 68 y.o. male who presents with left index finger pain, bruising, swelling, limitation of motion after hitting it against the back of of a chair.  States he hyperflexed at the MCP joint.  He reports dull, constant pain that is occasionally throbbing.  No numbness or tingling.  He tried to 1000 mg of Tylenol combined with 600 mg of ibuprofen once with improvement in his symptoms.  Symptoms are worse at the end of the day after using it.  He has a past medical history of hypercholesterolemia, hypertension.  PCP: [facility].   Past Medical History:  Diagnosis Date   Anxiety    History of kidney stones    Hypercholesterolemia    Hypertension    Sleep apnea     Past Surgical History:  Procedure Laterality Date   COLONOSCOPY  2011   PROSTATE BIOPSY N/A 05/26/2020   Procedure: PROSTATE BIOPSY Addison Bailey;  Surgeon: Orson Ape, MD;  Location: [facility] OR[facility];  Service: Urology;  Laterality: N/A;   TONSILLECTOMY  1963    Family History  Problem Relation Age of Onset   Heart disease Mother    Dementia Father     Social History   Tobacco Use   Smoking status: Former    Current packs/day: 0.00    Types: Cigarettes    Quit date: 2019    Years since quitting: 6.0   Smokeless tobacco: Never  Vaping Use   Vaping status: Never Used  Substance Use Topics   Alcohol use: Not Currently    Comment: none since 2019   Drug use: Not Currently    Types: Marijuana    Comment: several years ago    No current facility-administered medications for this encounter.  Current Outpatient Medications:    amLODipine (NORVASC) 5 MG tablet, Take 1 tablet (5 mg total) by mouth daily., Disp: 90 tablet, Rfl: 3   aspirin EC 81 MG tablet, Take 81 mg by mouth daily. Swallow whole., Disp: , Rfl:    busPIRone (BUSPAR) 10 MG tablet, Take 1 tablet (10 mg total) by mouth 2 (two) times daily., Disp: 180 tablet, Rfl: 3   cholecalciferol (VITAMIN D3) 25 MCG (1000  UNIT) tablet, Take 1,000 Units by mouth daily., Disp: , Rfl:    FLUoxetine (PROZAC) 40 MG capsule, Take 1 capsule (40 mg total) by mouth daily., Disp: 90 capsule, Rfl: 3   ibuprofen (ADVIL) 600 MG tablet, Take 1 tablet (600 mg total) by mouth every 8 (eight) hours as needed., Disp: 30 tablet, Rfl: 0   Multiple Vitamin (MULTIVITAMIN WITH MINERALS) TABS tablet, Take 1 tablet by mouth daily., Disp: , Rfl:    atorvastatin (LIPITOR) 20 MG tablet, TAKE 1 TABLET BY MOUTH DAILY, Disp: 100 tablet, Rfl: 2   Omega-3 Fatty Acids (FISH OIL) 1000 MG CAPS, Take 1,000 mg by mouth daily. (Patient not taking: Reported on 07/08/2023), Disp: , Rfl:   No Known Allergies   ROS  As noted in HPI.   Physical Exam  BP 118/80 (BP Location: Left Arm)   Pulse (!) 56   Temp 97.6 F (36.4 C) (Oral)   Resp 15   Ht 6' (1.829 m)   Wt 96.2 kg   SpO2 99%   BMI 28.75 kg/m   Constitutional: Well developed, well nourished, no acute distress Eyes:  EOMI, conjunctiva normal bilaterally HENT: Normocephalic, atraumatic,mucus membranes moist Respiratory: Normal inspiratory effort Cardiovascular: Normal rate GI: nondistended skin: No rash, skin intact Musculoskeletal:  no deformities Left hand: Tenderness at the left index MCP, distal metacarpal tenderness, tenderness and swelling at the base of the proximal phalanx.   Baseline Strength and Sensation to L hand with normal light touch intact for Pt, distal motor and sensation in with CR< 2 secs and pulse intact.  Hand with intact motor strength 5/5 flexion / extension / FDP / FDS  against resistance and 2-point discrimination intact at 5mm in affected digit. Skin intact.  Faint bruising at the base of proximal phalanx.  Rest of the hand  WNL.   Neurologic: Alert & oriented x 3, no focal neuro deficits Psychiatric: Speech and behavior appropriate   ED Course   Medications - No data to display  Orders Placed This Encounter  Procedures   DG Hand Complete Left     Standing Status:   Standing    Number of Occurrences:   1    Reason for Exam (SYMPTOM  OR DIAGNOSIS REQUIRED):   left 2nd finger pain and left hand pain since Tuesday due to injury   Apply splint rad gutter    Standing Status:   Standing    Number of Occurrences:   1    Laterality:   Left    No results found for this or any previous visit (from the past 24 hours). DG Hand Complete Left Result Date: 07/28/2023 CLINICAL DATA:  Left finger pain after injury EXAM: LEFT HAND - COMPLETE 3+ VIEW COMPARISON:  None Available. FINDINGS: Small nondisplaced intra-articular fracture of the base of the index finger proximal phalanx along its ulnar aspect. No additional fractures. No dislocation. Moderate first CMC joint osteoarthritis. Soft tissues within normal limits. IMPRESSION: Small nondisplaced fracture of the base of the index finger proximal phalanx. Electronically Signed   By: Duanne Guess D.O.   On: 07/28/2023 11:01    ED Clinical Impression  1. Closed nondisplaced fracture of proximal phalanx of left index finger, initial encounter      ED Assessment/Plan     Reviewed imaging independently.  Small nondisplaced fracture at the base of the proximal phalanx.  See radiology report for full details.  Patient has a small nondisplaced fracture at the base of the proximal phalanx.  Placing in a radial gutter splint.  Home with Tylenol/ibuprofen, ice.  Follow-up with orthopedics within the week.  Discussed imaging, MDM, treatment plan, and plan for follow-up with patient. Discussed sn/sx that should prompt return to the ED. patient agrees with plan.   Meds ordered this encounter  Medications   ibuprofen (ADVIL) 600 MG tablet    Sig: Take 1 tablet (600 mg total) by mouth every 8 (eight) hours as needed.    Dispense:  30 tablet    Refill:  0      *This clinic note was created using Scientist, clinical (histocompatibility and immunogenetics). Therefore, there may be occasional mistakes despite careful  proofreading.  ?    Domenick Gong, MD 07/29/23 [phone]

## 2023-07-29 DIAGNOSIS — S62611A Displaced fracture of proximal phalanx of left index finger, initial encounter for closed fracture: Secondary | ICD-10-CM | POA: Diagnosis not present

## 2023-07-31 DIAGNOSIS — Z91014 Allergy to mammalian meats: Secondary | ICD-10-CM | POA: Diagnosis not present

## 2023-07-31 DIAGNOSIS — L299 Pruritus, unspecified: Secondary | ICD-10-CM | POA: Diagnosis not present

## 2023-07-31 DIAGNOSIS — Z91018 Allergy to other foods: Secondary | ICD-10-CM | POA: Diagnosis not present

## 2023-08-01 ENCOUNTER — Encounter: Payer: Self-pay | Admitting: Urology

## 2023-08-01 ENCOUNTER — Ambulatory Visit: Payer: Medicare Other | Admitting: Urology

## 2023-08-01 VITALS — BP 129/78 | HR 55 | Ht 72.0 in | Wt 217.0 lb

## 2023-08-01 DIAGNOSIS — Z87891 Personal history of nicotine dependence: Secondary | ICD-10-CM

## 2023-08-01 DIAGNOSIS — R3129 Other microscopic hematuria: Secondary | ICD-10-CM | POA: Diagnosis not present

## 2023-08-01 DIAGNOSIS — R361 Hematospermia: Secondary | ICD-10-CM | POA: Diagnosis not present

## 2023-08-01 LAB — URINALYSIS, COMPLETE
Bilirubin, UA: NEGATIVE
Glucose, UA: NEGATIVE
Ketones, UA: NEGATIVE
Nitrite, UA: NEGATIVE
Protein,UA: NEGATIVE
Specific Gravity, UA: 1.01 (ref 1.005–1.030)
Urobilinogen, Ur: 0.2 mg/dL (ref 0.2–1.0)
pH, UA: 7 (ref 5.0–7.5)

## 2023-08-01 LAB — MICROSCOPIC EXAMINATION

## 2023-08-01 NOTE — Patient Instructions (Signed)

## 2023-08-01 NOTE — Progress Notes (Signed)
 LILLETTE Venetia PEDLAR Plume,acting as a scribe for Rosina Riis, MD.,have documented all relevant documentation on the behalf of Rosina Riis, MD,as directed by  Rosina Riis, MD while in the presence of Rosina Riis, MD.  08/01/23 12:54 PM   Spencer Smith October 12, 1955 [id number]  Referring provider: Fernand Fredy RAMAN, MD [address] [place],  [place] [address]  Chief Complaint  Patient presents with   blood in [place]    HPI: 68 year-old male who is referred for further evaluation of both hematospermia, but today also found to have microscopic hematuria as his urinalysis shows 3-10 RBC per high powered field. He does not have another recent urinalysis for comparison.   He was previously followed by Dr. Kassie and notably, we do not have his records.   He did have a prostate MRI in 2021 with a PI-RADS 5 lesion in the left transitional zone, a PI-RADS 4 lesion at the left and right peripheral zone, without evidence of extracapsular disease. His prostate volume is 70 cc's.   His most recent PSA on 01/04/2023 was 2.8.   He reports noticing blood in the sperm for the last couple of months. The color varies from red to brown. He recalls a similar occurrence years ago, but it was minimal. He underwent a prostate biopsy in December 2021, which was negative. He recalls seeing blood in his urine a while back but does not remember when. He has a history of a kidney stone 20 years ago but denies current kidney pain.  He is on baby aspirin .   He is a former smoker and quit in 2019 after smoking for about 6 years. He used to smoke more heavily when drinking. No current alcohol use.  Results for orders placed or performed in visit on 08/01/23  Microscopic Examination   Urine  Result Value Ref Range   WBC, UA 0-5 0 - 5 /hpf   RBC, Urine 3-10 (A) 0 - 2 /hpf   Epithelial Cells (non renal) 0-10 0 - 10 /hpf   Bacteria, UA Few None seen/Few  Urinalysis, Complete  Result Value Ref Range   Specific  Gravity, UA 1.010 1.005 - 1.030   pH, UA 7.0 5.0 - 7.5   Color, UA Yellow Yellow   Appearance Ur Clear Clear   Leukocytes,UA Trace (A) Negative   Protein,UA Negative Negative/Trace   Glucose, UA Negative Negative   Ketones, UA Negative Negative   RBC, UA Trace (A) Negative   Bilirubin, UA Negative Negative   Urobilinogen, Ur 0.2 0.2 - 1.0 mg/dL   Nitrite, UA Negative Negative   Microscopic Examination See below:     PMH: Past Medical History:  Diagnosis Date   Anxiety    History of kidney stones    Hypercholesterolemia    Hypertension    Sleep apnea     Surgical History: Past Surgical History:  Procedure Laterality Date   COLONOSCOPY  2011   PROSTATE BIOPSY N/A 05/26/2020   Procedure: PROSTATE BIOPSY GRAYCE;  Surgeon: Kassie Ozell SAUNDERS, MD;  Location: [facility] ORS;  Service: Urology;  Laterality: N/A;   TONSILLECTOMY  1963    Home Medications:  Allergies as of 08/01/2023       Reactions   Alpha-gal Hives, Itching, Rash        Medication List        Accurate as of August 01, 2023 12:54 PM. If you have any questions, ask your nurse or doctor.  STOP taking these medications    atorvastatin  20 MG tablet Commonly known as: LIPITOR Stopped by: Rosina Riis   Fish Oil 1000 MG Caps Stopped by: Rosina Riis   ibuprofen  600 MG tablet Commonly known as: ADVIL  Stopped by: Rosina Riis       TAKE these medications    amLODipine  5 MG tablet Commonly known as: NORVASC  Take 1 tablet (5 mg total) by mouth daily.   aspirin  EC 81 MG tablet Take 81 mg by mouth daily. Swallow whole.   busPIRone  10 MG tablet Commonly known as: BUSPAR  Take 1 tablet (10 mg total) by mouth 2 (two) times daily.   cholecalciferol 25 MCG (1000 UNIT) tablet Commonly known as: VITAMIN D3 Take 1,000 Units by mouth daily.   FLUoxetine  40 MG capsule Commonly known as: PROZAC  TAKE 1 CAPSULE BY MOUTH DAILY   multivitamin with minerals Tabs tablet Take 1 tablet by mouth  daily.        Allergies:  Allergies  Allergen Reactions   Alpha-Gal Hives, Itching and Rash    Family History: Family History  Problem Relation Age of Onset   Heart disease Mother    Dementia Father     Social History:  reports that he quit smoking about 6 years ago. His smoking use included cigarettes. He has never used smokeless tobacco. He reports that he does not currently use drugs after having used the following drugs: Marijuana. No history on file for alcohol use.   Physical Exam: BP 129/78   Pulse (!) 55   Ht 6' (1.829 m)   Wt 217 lb (98.4 kg)   BMI 29.43 kg/m   Constitutional:  Alert and oriented, No acute distress. HEENT:  AT, moist mucus membranes.  Trachea midline, no masses. GU: Enlarged prostate with some nodularity, likely BPH rather than cancerous nodules. Neurologic: Grossly intact, no focal deficits, moving all 4 extremities. Psychiatric: Normal mood and affect.  Pertinent Imaging: EXAM: MR PROSTATE WITHOUT AND WITH CONTRAST   TECHNIQUE: Multiplanar multisequence MRI images were obtained of the pelvis centered about the prostate. Pre and post contrast images were obtained.   CONTRAST:  10mL GADAVIST  GADOBUTROL  1 MMOL/ML IV SOLN   COMPARISON:  CT pelvis 11/07/2004   FINDINGS: Prostate:   Region of interest # 1: PI-RADS category 4 lesion of the right anterior peripheral zone involving the base and mid gland, with reduced T2 signal and early enhancement. This measures 0.94 cubic cm (1.8 by 1.0 by 1.5 cm) and is shown for example on image 11 of series 4 and image 12 of series 22.   Region of interest # 2: PI-RADS category 4 lesion the left posterolateral and left anterior peripheral zone in the mid gland and left anterior peripheral zone in the apex, with reduced T2 signal and mild early enhancement. This measures 0.61 cubic cm (1.8 by 0.8 by 0.9 cm) and is shown for example on image 17 of series 4.   Region of interest # 3: PI-RADS  category 5 partially encapsulated T2 hypointense lesion in the left anterior and posterior transition zone in the base and mid gland, with restricted diffusion noted. This measures 1.98 cubic cm (2.2 by 1.7 by 0.9 cm) and is shown for example on image 10 of series 4.   There is in capsulated nodularity in the right transition zone with restriction of diffusion, but the encapsulated appearance makes this unlikely to be malignant.   Volume: 3D volumetric analysis: Prostate volume 69.95 cubic cm (7.0 by 5.1 by  4.4 cm).   Transcapsular spread:  Absent   Seminal vesicle involvement: Absent   Neurovascular bundle involvement: Absent   Pelvic adenopathy: Absent   Bone metastasis: Absent   Other findings: Sigmoid colon diverticulosis.   IMPRESSION: 1. PI-RADS category 5 lesion of the left transition zone, with single PI-RADS category 4 lesions in the right and left peripheral zone as detailed above. Targeting data sent to [facility]. 2. No evidence of extracapsular extension, seminal vesicle involvement, or metastatic disease. 3. Sigmoid colon diverticulosis. 4. Benign prostatic hypertrophy.  Prostate volume 69.95 cubic cm.     Electronically Signed   By: Ryan Salvage M.D.   On: 04/13/2020 15:29   This was personally reviewed and I agree with the radiologic interpretation.   Assessment & Plan:    1. Hematospermia -  I explained to the patient some of the conditions that may cause hematospermia, such as: disorders of the prostate gland, seminal vesicles, spermatic cord, and ejaculatory duct system; urogenital infections including sexually transmitted infections (eg, chlamydia, herpes simplex virus, gonorrhea, trichomonas); metastatic cancers; vascular malformations; congenital and drug-induced bleeding disorders; and even frequent daily ejaculation over a period of several weeks.  I reassured him that his exam was normal and that we may never discover a reason for his  hematospermia and it is most likely a benign symptom.    -His PSA is low, previous negative biopsy and his prostate exam is most consistent with BPH thus unlikely to represent prostate cancer  2. Microscopic hematuria - CT urogram to evaluate the kidneys, ureters, and bladder.  - Schedule a cystoscopy to directly visualize the bladder and rule out bladder cancer.  3. History of tobacco use - Risk factor for bladder cancer  Return for CT urogram and cystoscopy.  I have reviewed the above documentation for accuracy and completeness, and I agree with the above.   Rosina Riis, MD    [facility] [address] [place], [place] [address] [phone]

## 2023-08-07 ENCOUNTER — Telehealth: Payer: Self-pay

## 2023-08-07 DIAGNOSIS — R109 Unspecified abdominal pain: Secondary | ICD-10-CM

## 2023-08-07 DIAGNOSIS — Z87442 Personal history of urinary calculi: Secondary | ICD-10-CM

## 2023-08-07 NOTE — Progress Notes (Unsigned)
08/08/2023 5:12 PM   Spencer Smith August 03, 1955 [id number]  Referring provider: Margaretann Loveless, MD [address] [place],  [place] [address]  Urological history: 1.  Nephrolithiasis ~Pass spontaneously approximately 20 years ago  2. Elevated PSA -prostate MRI (2021) -  PI-RADS category 5 lesion of the left transition zone, with single PI-RADS category 4 lesions in the right and left peripheral zone as detailed above. Targeting data sent to [facility].  No evidence of extracapsular extension, seminal vesicle involvement, or metastatic disease.  Sigmoid colon diverticulosis.  Benign prostatic hypertrophy.  Prostate volume 69.95 cubic cm. -fusion biopsy (2021) - negative   3. BPH with LU TS -PSA (12/2022) 2.8  Chief Complaint  Patient presents with   Nephrolithiasis   HPI: Spencer Smith is a 68 y.o. male who presents today for gross heme and pain.   Previous records reviewed.   He was just seen by Dr. Apolinar Junes on February 6th for symptoms of hematospermia and incidental finding of microscopic hematuria.  She had a scheduled for CT urogram and cystoscopy.  Last evening, he was sitting reading in his chair and had sudden onset of left-sided flank pain.  The pain was reminiscent of previous stones.  It lasted for several hours.  He took some ibuprofen and Tylenol and it abated.    Patient denies any modifying or aggravating factors.  Patient denies any recent UTI's, gross hematuria, dysuria or suprapubic/flank pain.  Patient denies any fevers, chills, nausea or vomiting.    UA yellow clear, specific gravity 1.015, 2+ blood, pH 5.5, 0-5 WBCs, greater than 30 RBCs, 0-10 epithelial cells, mucus threads are present and a few bacteria.  KUB unfortunately the the kidneys are not readily visualized due to the stool and gas.  It is possible there may be a right renal calculus and a left distal ureteral calculus.  I do not have any recent imaging for comparison.  CT urogram from today  demonstrated punctate non obstructive calculus in the right kidney and prostatomegaly.     PMH: Past Medical History:  Diagnosis Date   Anxiety    History of kidney stones    Hypercholesterolemia    Hypertension    Sleep apnea     Surgical History: Past Surgical History:  Procedure Laterality Date   COLONOSCOPY  2011   PROSTATE BIOPSY N/A 05/26/2020   Procedure: PROSTATE BIOPSY Addison Bailey;  Surgeon: Orson Ape, MD;  Location: [facility] ORS;  Service: Urology;  Laterality: N/A;   TONSILLECTOMY  1963    Home Medications:  Allergies as of 08/08/2023       Reactions   Alpha-gal Hives, Itching, Rash        Medication List        Accurate as of August 08, 2023  5:12 PM. If you have any questions, ask your nurse or doctor.          amLODipine 5 MG tablet Commonly known as: NORVASC Take 1 tablet (5 mg total) by mouth daily.   aspirin EC 81 MG tablet Take 81 mg by mouth daily. Swallow whole.   busPIRone 10 MG tablet Commonly known as: BUSPAR Take 1 tablet (10 mg total) by mouth 2 (two) times daily. What changed: additional instructions   cholecalciferol 25 MCG (1000 UNIT) tablet Commonly known as: VITAMIN D3 Take 1,000 Units by mouth daily.   FLUoxetine 40 MG capsule Commonly known as: PROZAC TAKE 1 CAPSULE BY MOUTH DAILY   multivitamin with minerals Tabs tablet Take  1 tablet by mouth daily.        Allergies:  Allergies  Allergen Reactions   Alpha-Gal Hives, Itching and Rash    Family History: Family History  Problem Relation Age of Onset   Heart disease Mother    Dementia Father     Social History:  reports that he quit smoking about 6 years ago. His smoking use included cigarettes. He has never used smokeless tobacco. He reports that he does not currently use drugs after having used the following drugs: Marijuana. No history on file for alcohol use.  ROS: Pertinent ROS in HPI  Physical Exam: BP 136/79   Pulse (!) 57   Ht 6' (1.829 m)    Wt 215 lb (97.5 kg)   BMI 29.16 kg/m   Constitutional:  Well nourished. Alert and oriented, No acute distress. HEENT: [place] AT, moist mucus membranes.  Trachea midline, no masses. Cardiovascular: No clubbing, cyanosis, or edema. Respiratory: Normal respiratory effort, no increased work of breathing. Neurologic: Grossly intact, no focal deficits, moving all 4 extremities. Psychiatric: Normal mood and affect.  Laboratory Data: Lab Results  Component Value Date   WBC 5.7 01/04/2023   HGB 14.5 01/04/2023   HCT 42.6 01/04/2023   MCV 91 01/04/2023   PLT 195 05/17/2020    Lab Results  Component Value Date   CREATININE 0.80 07/08/2023       Component Value Date/Time   CHOL 137 07/08/2023 1003   HDL 59 07/08/2023 1003   LDLCALC 60 07/08/2023 1003    Lab Results  Component Value Date   AST 23 07/08/2023   Lab Results  Component Value Date   ALT 25 07/08/2023   Urinalysis See EPIC and HPI  I have reviewed the labs.   Pertinent Imaging: CLINICAL DATA:  Microscopic hematuria history of prostate cancer * Tracking Code: BO *   EXAM: CT ABDOMEN AND PELVIS WITHOUT AND WITH CONTRAST   TECHNIQUE: Multidetector CT imaging of the abdomen and pelvis was performed following the standard protocol before and following the bolus administration of intravenous contrast.   RADIATION DOSE REDUCTION: This exam was performed according to the departmental dose-optimization program which includes automated exposure control, adjustment of the mA and/or kV according to patient size and/or use of iterative reconstruction technique.   CONTRAST:  OMNIPAQUE IOHEXOL 300 MG/ML  SOLN   COMPARISON:  11/07/2004   FINDINGS: Lower chest: No acute abnormality. Bibasilar scarring or atelectasis.   Hepatobiliary: No solid liver abnormality is seen. Multiple simple liver cysts, benign, requiring no further follow-up or characterization. Hepatomegaly, maximum coronal span 21.0 cm.  No gallstones, gallbladder wall thickening, or biliary dilatation.   Pancreas: Unremarkable. No pancreatic ductal dilatation or surrounding inflammatory changes.   Spleen: Normal in size without significant abnormality.   Adrenals/Urinary Tract: Adrenal glands are unremarkable. Punctuate nonobstructive calculus of the inferior pole of the right kidney (series 5, image 69). No left-sided calculi, ureteral calculi, or hydronephrosis. Bladder is unremarkable. No urinary tract filling defect on delayed phase imaging.   Stomach/Bowel: Stomach is within normal limits. Appendix appears normal. No evidence of bowel wall thickening, distention, or inflammatory changes.   Vascular/Lymphatic: Aortic atherosclerosis. No enlarged abdominal or pelvic lymph nodes.   Reproductive: Prostatomegaly.   Other: No abdominal wall hernia or abnormality. No ascites.   Musculoskeletal: No acute or significant osseous findings.   IMPRESSION: 1. Punctuate nonobstructive calculus of the inferior pole of the right kidney. No left-sided calculi, ureteral calculi, or hydronephrosis. 2. No suspicious  mass or contrast enhancement. No urinary tract filling defect on delayed phase imaging. 3. Prostatomegaly. 4. No evidence of lymphadenopathy or metastatic disease in the abdomen or pelvis. 5. Hepatomegaly.   Aortic Atherosclerosis (ICD10-I70.0).     Electronically Signed   By: Jearld Lesch M.D.   On: 08/08/2023 16:00   KUB no stone seen, radiologist interpretation still pending I have independently reviewed the films.    Assessment & Plan:    1. Flank pain -May be muscle skeletal in nature or passage of a stone from the left -Today's CT urogram did not note a left hydronephrosis or left ureteral stone  2. Microscopic hematuria -UA w/ micro heme  -urine culture -May be due to nephrolithiasis or prostatomegaly   -cystoscopy is pending  3. Stone -KUB no stones -CT urogram punctate right renal  stone  Return for keep cysto appointment with Dr. Apolinar Junes .  These notes generated with voice recognition software. I apologize for typographical errors.  Cloretta Ned  [facility] [address]  [address] [place], [place] [address] [phone]

## 2023-08-07 NOTE — Telephone Encounter (Signed)
Pt LM on triage line stating that he has had blood in his urine and is now having pain. He questions what the next steps are.   Called pt back he states that he has a history of kidney stones (Dr. Sheppard Penton) and is having the same symptoms currently. He c/o flank pain (7 out of 10) and nausea. Patient states that he has tried Tylenol with no relief. He denies chills, fever, or gross hematuria. Pt scheduled for next day appt with KUB prior. Advised pt on ED precautions, advised on NSAIDS if able to take and the use of heating pad and warm shower for pain relief. Pt voiced understanding.

## 2023-08-08 ENCOUNTER — Ambulatory Visit: Payer: Medicare Other | Admitting: Urology

## 2023-08-08 ENCOUNTER — Ambulatory Visit
Admission: RE | Admit: 2023-08-08 | Discharge: 2023-08-08 | Disposition: A | Discharge status: Home / Self Care | Payer: Medicare Other | Source: Ambulatory Visit | Attending: Urology

## 2023-08-08 ENCOUNTER — Ambulatory Visit
Admission: RE | Admit: 2023-08-08 | Discharge: 2023-08-08 | Disposition: A | Discharge status: Home / Self Care | Payer: Medicare Other | Attending: Urology | Admitting: Urology

## 2023-08-08 ENCOUNTER — Ambulatory Visit
Admission: RE | Admit: 2023-08-08 | Discharge: 2023-08-08 | Disposition: A | Discharge status: Home / Self Care | Payer: Medicare Other | Source: Ambulatory Visit | Attending: Urology | Admitting: Urology

## 2023-08-08 ENCOUNTER — Other Ambulatory Visit
Admission: RE | Admit: 2023-08-08 | Discharge: 2023-08-08 | Disposition: A | Discharge status: Home / Self Care | Payer: Medicare Other | Source: Home / Self Care | Attending: Urology | Admitting: Urology

## 2023-08-08 ENCOUNTER — Encounter: Payer: Self-pay | Admitting: Urology

## 2023-08-08 VITALS — BP 136/79 | HR 57 | Ht 72.0 in | Wt 215.0 lb

## 2023-08-08 DIAGNOSIS — Z87442 Personal history of urinary calculi: Secondary | ICD-10-CM | POA: Insufficient documentation

## 2023-08-08 DIAGNOSIS — Z87891 Personal history of nicotine dependence: Secondary | ICD-10-CM | POA: Insufficient documentation

## 2023-08-08 DIAGNOSIS — J9811 Atelectasis: Secondary | ICD-10-CM | POA: Diagnosis not present

## 2023-08-08 DIAGNOSIS — R109 Unspecified abdominal pain: Secondary | ICD-10-CM | POA: Insufficient documentation

## 2023-08-08 DIAGNOSIS — K7689 Other specified diseases of liver: Secondary | ICD-10-CM | POA: Diagnosis not present

## 2023-08-08 DIAGNOSIS — R3129 Other microscopic hematuria: Secondary | ICD-10-CM | POA: Diagnosis not present

## 2023-08-08 DIAGNOSIS — R361 Hematospermia: Secondary | ICD-10-CM | POA: Insufficient documentation

## 2023-08-08 DIAGNOSIS — R160 Hepatomegaly, not elsewhere classified: Secondary | ICD-10-CM | POA: Diagnosis not present

## 2023-08-08 DIAGNOSIS — N200 Calculus of kidney: Secondary | ICD-10-CM | POA: Diagnosis not present

## 2023-08-08 DIAGNOSIS — I878 Other specified disorders of veins: Secondary | ICD-10-CM | POA: Diagnosis not present

## 2023-08-08 LAB — URINALYSIS, COMPLETE (UACMP) WITH MICROSCOPIC
Bilirubin Urine: NEGATIVE
Glucose, UA: NEGATIVE mg/dL
Ketones, ur: NEGATIVE mg/dL
Leukocytes,Ua: NEGATIVE
Nitrite: NEGATIVE
Protein, ur: NEGATIVE mg/dL
Specific Gravity, Urine: 1.006 (ref 1.005–1.030)
Squamous Epithelial / HPF: 0 /[HPF] (ref 0–5)
pH: 6 (ref 5.0–8.0)

## 2023-08-08 LAB — MICROSCOPIC EXAMINATION: RBC, Urine: >30 /[HPF] — AB (ref 0–2)

## 2023-08-08 LAB — URINALYSIS, COMPLETE
Bilirubin, UA: NEGATIVE
Glucose, UA: NEGATIVE
Ketones, UA: NEGATIVE
Leukocytes,UA: NEGATIVE
Nitrite, UA: NEGATIVE
Protein,UA: NEGATIVE
Specific Gravity, UA: 1.015 (ref 1.005–1.030)
Urobilinogen, Ur: 0.2 mg/dL (ref 0.2–1.0)
pH, UA: 5.5 (ref 5.0–7.5)

## 2023-08-08 MED ORDER — IOHEXOL 300 MG/ML  SOLN
100.0000 mL | Freq: Once | INTRAMUSCULAR | Status: AC | PRN
  Administered 2023-08-08: 100 mL via INTRAVENOUS

## 2023-08-08 NOTE — Addendum Note (Signed)
Addended by: Michiel Cowboy A on: 08/08/2023 11:36 AM   Modules accepted: Orders

## 2023-08-12 LAB — CULTURE, URINE COMPREHENSIVE

## 2023-08-13 ENCOUNTER — Other Ambulatory Visit: Payer: Self-pay | Admitting: Internal Medicine

## 2023-08-13 ENCOUNTER — Encounter: Payer: Self-pay | Admitting: Dermatology

## 2023-08-13 ENCOUNTER — Ambulatory Visit: Payer: Medicare Other | Admitting: Dermatology

## 2023-08-13 DIAGNOSIS — Z1283 Encounter for screening for malignant neoplasm of skin: Secondary | ICD-10-CM | POA: Diagnosis not present

## 2023-08-13 DIAGNOSIS — L719 Rosacea, unspecified: Secondary | ICD-10-CM

## 2023-08-13 DIAGNOSIS — L814 Other melanin hyperpigmentation: Secondary | ICD-10-CM

## 2023-08-13 DIAGNOSIS — I10 Essential (primary) hypertension: Secondary | ICD-10-CM

## 2023-08-13 DIAGNOSIS — D224 Melanocytic nevi of scalp and neck: Secondary | ICD-10-CM | POA: Diagnosis not present

## 2023-08-13 DIAGNOSIS — W908XXA Exposure to other nonionizing radiation, initial encounter: Secondary | ICD-10-CM

## 2023-08-13 DIAGNOSIS — L578 Other skin changes due to chronic exposure to nonionizing radiation: Secondary | ICD-10-CM

## 2023-08-13 DIAGNOSIS — B078 Other viral warts: Secondary | ICD-10-CM | POA: Diagnosis not present

## 2023-08-13 DIAGNOSIS — S62641D Nondisplaced fracture of proximal phalanx of left index finger, subsequent encounter for fracture with routine healing: Secondary | ICD-10-CM | POA: Diagnosis not present

## 2023-08-13 DIAGNOSIS — D229 Melanocytic nevi, unspecified: Secondary | ICD-10-CM

## 2023-08-13 DIAGNOSIS — D1801 Hemangioma of skin and subcutaneous tissue: Secondary | ICD-10-CM

## 2023-08-13 DIAGNOSIS — L821 Other seborrheic keratosis: Secondary | ICD-10-CM

## 2023-08-13 DIAGNOSIS — L820 Inflamed seborrheic keratosis: Secondary | ICD-10-CM | POA: Diagnosis not present

## 2023-08-13 MED ORDER — METRONIDAZOLE 1 % EX GEL: CUTANEOUS | 3 refills | Status: DC

## 2023-08-13 NOTE — Patient Instructions (Addendum)
Cryotherapy Aftercare  Wash gently with soap and water everyday.   Apply Vaseline Jelly daily until healed.   Continue Metrogel 1% daily to face.   Recommend daily broad spectrum sunscreen SPF 30+ to sun-exposed areas, reapply every 2 hours as needed. Call for new or changing lesions.  Staying in the shade or wearing long sleeves, sun glasses (UVA+UVB protection) and wide brim hats (4-inch brim around the entire circumference of the hat) are also recommended for sun protection.       Melanoma ABCDEs  Melanoma is the most dangerous type of skin cancer, and is the leading cause of death from skin disease.  You are more likely to develop melanoma if you: Have light-colored skin, light-colored eyes, or red or blond hair Spend a lot of time in the sun Tan regularly, either outdoors or in a tanning bed Have had blistering sunburns, especially during childhood Have a close family member who has had a melanoma Have atypical moles or large birthmarks  Early detection of melanoma is key since treatment is typically straightforward and cure rates are extremely high if we catch it early.   The first sign of melanoma is often a change in a mole or a new dark spot.  The ABCDE system is a way of remembering the signs of melanoma.  A for asymmetry:  The two halves do not match. B for border:  The edges of the growth are irregular. C for color:  A mixture of colors are present instead of an even brown color. D for diameter:  Melanomas are usually (but not always) greater than 6mm - the size of a pencil eraser. E for evolution:  The spot keeps changing in size, shape, and color.  Please check your skin once per month between visits. You can use a small mirror in front and a large mirror behind you to keep an eye on the back side or your body.   If you see any new or changing lesions before your next follow-up, please call to schedule a visit.  Please continue daily skin protection including  broad spectrum sunscreen SPF 30+ to sun-exposed areas, reapplying every 2 hours as needed when you're outdoors.   Staying in the shade or wearing long sleeves, sun glasses (UVA+UVB protection) and wide brim hats (4-inch brim around the entire circumference of the hat) are also recommended for sun protection.      Due to recent changes in healthcare laws, you may see results of your pathology and/or laboratory studies on MyChart before the doctors have had a chance to review them. We understand that in some cases there may be results that are confusing or concerning to you. Please understand that not all results are received at the same time and often the doctors may need to interpret multiple results in order to provide you with the best plan of care or course of treatment. Therefore, we ask that you please give [place] 2 business days to thoroughly review all your results before contacting the office for clarification. Should we see a critical lab result, you will be contacted sooner.   If You Need Anything After Your Visit  If you have any questions or concerns for your doctor, please call our main line at [phone] and press option 4 to reach your doctor's medical assistant. If no one answers, please leave a voicemail as directed and we will return your call as soon as possible. Messages left after 4 pm will be answered the following business  day.   You may also send [place] a message via MyChart. We typically respond to MyChart messages within 1-2 business days.  For prescription refills, please ask your pharmacy to contact our office. Our fax number is [phone].  If you have an urgent issue when the clinic is closed that cannot wait until the next business day, you can page your doctor at the number below.    Please note that while we do our best to be available for urgent issues outside of office hours, we are not available 24/7.   If you have an urgent issue and are unable to reach [place], you  may choose to seek medical care at your doctor's office, retail clinic, urgent care center, or emergency room.  If you have a medical emergency, please immediately call 911 or go to the emergency department.  Pager Numbers  - Dr. Gwen Pounds: [phone]  - Dr. Roseanne Reno: [phone]  - Dr. Katrinka Blazing: [phone]   In the event of inclement weather, please call our main line at [phone] for an update on the status of any delays or closures.  Dermatology Medication Tips: Please keep the boxes that topical medications come in in order to help keep track of the instructions about where and how to use these. Pharmacies typically print the medication instructions only on the boxes and not directly on the medication tubes.   If your medication is too expensive, please contact our office at [phone] option 4 or send [place] a message through MyChart.   We are unable to tell what your co-pay for medications will be in advance as this is different depending on your insurance coverage. However, we may be able to find a substitute medication at lower cost or fill out paperwork to get insurance to cover a needed medication.   If a prior authorization is required to get your medication covered by your insurance company, please allow [place] 1-2 business days to complete this process.  Drug prices often vary depending on where the prescription is filled and some pharmacies may offer cheaper prices.  The website www.goodrx.com contains coupons for medications through different pharmacies. The prices here do not account for what the cost may be with help from insurance (it may be cheaper with your insurance), but the website can give you the price if you did not use any insurance.  - You can print the associated coupon and take it with your prescription to the pharmacy.  - You may also stop by our office during regular business hours and pick up a GoodRx coupon card.  - If you need your prescription sent  electronically to a different pharmacy, notify our office through [facility] or by phone at [phone] option 4.     Si Usted Necesita Algo Despus de Su Visita  Tambin puede enviarnos un mensaje a travs de Clinical cytogeneticist. Por lo general respondemos a los mensajes de MyChart en el transcurso de 1 a 2 das hbiles.  Para renovar recetas, por favor pida a su farmacia que se ponga en contacto con nuestra oficina. Annie Sable de fax es [place] [phone].  Si tiene un asunto urgente cuando la clnica est cerrada y que no puede esperar hasta el siguiente da hbil, puede llamar/localizar a su doctor(a) al nmero que aparece a continuacin.   Por favor, tenga en cuenta que aunque hacemos todo lo posible para estar disponibles para asuntos urgentes fuera del horario de [place], no estamos disponibles las 24 horas del da, los [address]  de la semana.   Si tiene un problema urgente y no puede comunicarse con nosotros, puede optar por buscar atencin mdica  en el consultorio de su doctor(a), en una clnica privada, en un centro de atencin urgente o en una sala de emergencias.  Si tiene Engineer, drilling, por favor llame inmediatamente al 911 o vaya a la sala de emergencias.  Nmeros de bper  - Dr. Gwen Pounds: [phone]  - Dra. Roseanne Reno: [phone]  - Dr. Katrinka Blazing: [phone]   En caso de inclemencias del tiempo, por favor llame a Lacy Duverney principal al [phone] para una actualizacin sobre el [place] de cualquier retraso o cierre.  Consejos para la medicacin en dermatologa: Por favor, guarde las cajas en las que vienen los medicamentos de uso tpico para ayudarle a seguir las instrucciones sobre dnde y cmo usarlos. Las farmacias generalmente imprimen las instrucciones del medicamento slo en las cajas y no directamente en los tubos del [place].   Si su medicamento es muy caro, por favor, pngase en contacto con Rolm Gala llamando al [phone] y presione la  opcin 4 o envenos un mensaje a travs de Clinical cytogeneticist.   No podemos decirle cul ser su copago por los medicamentos por adelantado ya que esto es diferente dependiendo de la cobertura de su seguro. Sin embargo, es posible que podamos encontrar un medicamento sustituto a Audiological scientist un formulario para que el seguro cubra el medicamento que se considera necesario.   Si se requiere una autorizacin previa para que su compaa de seguros [place] su medicamento, por favor permtanos de 1 a 2 das hbiles para completar [address]  Los precios de los medicamentos varan con frecuencia dependiendo del Environmental consultant de dnde se surte la receta y alguna farmacias pueden ofrecer precios ms baratos.  El sitio web www.goodrx.com tiene cupones para medicamentos de Health and safety inspector. Los precios aqu no tienen en cuenta lo que podra costar con la ayuda del seguro (puede ser ms barato con su seguro), pero el sitio web puede darle el precio si no utiliz Tourist information centre manager.  - Puede imprimir el cupn correspondiente y llevarlo con su receta a la farmacia.  - Tambin puede pasar por nuestra oficina durante el horario de atencin regular y Education officer, museum una tarjeta de cupones de GoodRx.  - Si necesita que su receta se enve electrnicamente a una farmacia diferente, informe a nuestra oficina a travs de MyChart de  o por telfono llamando al [phone] y presione la opcin 4.

## 2023-08-13 NOTE — Progress Notes (Signed)
Follow-Up Visit   Subjective  Spencer Smith is a 68 y.o. male who presents for the following: Skin Cancer Screening and Full Body Skin Exam. No personal Hx of skin cancer or dysplastic nevi.   Check spot on right anterior neck. Rubbed, irritated. Would like removed.   Rosacea. Face. Uses Metrogel 1% daily.   Patient reports the issues with dermatitis last year were related to Alpha-gal. Has been avoiding red meat. Has not had any trouble with rash since then.   The patient presents for Total-Body Skin Exam (TBSE) for skin cancer screening and mole check. The patient has spots, moles and lesions to be evaluated, some may be new or changing and the patient may have concern these could be cancer.    The following portions of the chart were reviewed this encounter and updated as appropriate: medications, allergies, medical history  Review of Systems:  No other skin or systemic complaints except as noted in HPI or Assessment and Plan.  Objective  Well appearing patient in no apparent distress; mood and affect are within normal limits.  A full examination was performed including scalp, head, eyes, ears, nose, lips, neck, chest, axillae, abdomen, back, buttocks, bilateral upper extremities, bilateral lower extremities, hands, feet, fingers, toes, fingernails, and toenails. All findings within normal limits unless otherwise noted below.   Relevant physical exam findings are noted in the Assessment and Plan.  R anterior neck x1 Verrucous papules -- Discussed viral etiology and contagion.  Right Lateral Heel x1 Erythematous keratotic or waxy stuck-on papule or plaque.  Assessment & Plan   SKIN CANCER SCREENING PERFORMED TODAY.  ACTINIC DAMAGE. Face, chest, back. - Chronic condition, secondary to cumulative UV/sun exposure - diffuse scaly erythematous macules with underlying dyspigmentation - Recommend daily broad spectrum sunscreen SPF 30+ to sun-exposed areas, reapply every 2 hours  as needed.  - Staying in the shade or wearing long sleeves, sun glasses (UVA+UVB protection) and wide brim hats (4-inch brim around the entire circumference of the hat) are also recommended for sun protection.  - Call for new or changing lesions.  LENTIGINES, SEBORRHEIC KERATOSES, HEMANGIOMAS - Benign normal skin lesions. Back.  - Benign-appearing - Call for any changes  MELANOCYTIC NEVI - Tan-brown and/or pink-flesh-colored symmetric macules and papules - 4 mm flesh papule at right occipital scalp - Benign appearing on exam today - Observation - Call clinic for new or changing moles - Recommend daily use of broad spectrum spf 30+ sunscreen to sun-exposed areas.    ROSACEA Exam Erythema with telangiectasias +/- scattered inflammatory papules at nose, chin, cheeks, forehead  Chronic and persistent condition with duration or expected duration over one year. Condition is improving with treatment but not currently at goal.    Rosacea is a chronic progressive skin condition usually affecting the face of adults, causing redness and/or acne bumps. It is treatable but not curable. It sometimes affects the eyes (ocular rosacea) as well. It may respond to topical and/or systemic medication and can flare with stress, sun exposure, alcohol, exercise, topical steroids (including hydrocortisone/cortisone 10) and some foods.  Daily application of broad spectrum spf 30+ sunscreen to face is recommended to reduce flares.  Patient denies grittiness of the eyes  Treatment Plan Continue Metrogel 1% daily to face. Use daily for better results   INFLAMED SEBORRHEIC KERATOSIS Exam: Erythematous keratotic or waxy stuck-on papule at right forearm.  Treatment: Benign-appearing.  Call clinic for new or changing lesions.  Reassured benign age-related growth.  Recommend observation.  Discussed cryotherapy if spot(s) become irritated or inflamed. Patient deferred treatment at this time.    SEBORRHEIC  KERATOSIS - Waxy hypopigmented papule at left forearm - Benign-appearing - Discussed benign etiology and prognosis. - Observe - Call for any changes  OTHER VIRAL WARTS R anterior neck x1 Vs ISK  Viral Wart (HPV) Counseling  Discussed viral / HPV (Human Papilloma Virus) etiology and risk of spread /infectivity to other areas of body as well as to other people.  Multiple treatments and methods may be required to clear warts and it is possible treatment may not be successful.  Treatment risks include discoloration; scarring and there is still potential for wart recurrence. Destruction of lesion - R anterior neck x1  Destruction method: cryotherapy   Informed consent: discussed and consent obtained   Lesion destroyed using liquid nitrogen: Yes   Region frozen until ice ball extended beyond lesion: Yes   Outcome: patient tolerated procedure well with no complications   Post-procedure details: wound care instructions given   Additional details:  Prior to procedure, discussed risks of blister formation, small wound, skin dyspigmentation, or rare scar following cryotherapy. Recommend Vaseline ointment to treated areas while healing.  INFLAMED SEBORRHEIC KERATOSIS Right Lateral Heel x1 Symptomatic, irritating, patient would like treated. Destruction of lesion - Right Lateral Heel x1  Destruction method: cryotherapy   Informed consent: discussed and consent obtained   Lesion destroyed using liquid nitrogen: Yes   Region frozen until ice ball extended beyond lesion: Yes   Outcome: patient tolerated procedure well with no complications   Post-procedure details: wound care instructions given   Additional details:  Prior to procedure, discussed risks of blister formation, small wound, skin dyspigmentation, or rare scar following cryotherapy. Recommend Vaseline ointment to treated areas while healing.   Return in about 1 year (around 08/12/2024) for TBSE.  I, Lawson Radar, CMA, am acting as  scribe for Willeen Niece, MD.   Documentation: I have reviewed the above documentation for accuracy and completeness, and I agree with the above.  Willeen Niece, MD

## 2023-08-27 ENCOUNTER — Encounter: Payer: Self-pay | Admitting: Urology

## 2023-08-27 ENCOUNTER — Ambulatory Visit (INDEPENDENT_AMBULATORY_CARE_PROVIDER_SITE_OTHER): Payer: Medicare Other | Admitting: Urology

## 2023-08-27 VITALS — BP 135/79 | HR 58 | Ht 72.0 in | Wt 215.0 lb

## 2023-08-27 DIAGNOSIS — R3129 Other microscopic hematuria: Secondary | ICD-10-CM | POA: Diagnosis not present

## 2023-08-27 DIAGNOSIS — N329 Bladder disorder, unspecified: Secondary | ICD-10-CM

## 2023-08-27 LAB — URINALYSIS, COMPLETE
Bilirubin, UA: NEGATIVE
Glucose, UA: NEGATIVE
Ketones, UA: NEGATIVE
Nitrite, UA: NEGATIVE
Protein,UA: NEGATIVE
Specific Gravity, UA: 1.02 (ref 1.005–1.030)
Urobilinogen, Ur: 0.2 mg/dL (ref 0.2–1.0)
pH, UA: 6.5 (ref 5.0–7.5)

## 2023-08-27 LAB — MICROSCOPIC EXAMINATION

## 2023-08-27 NOTE — Progress Notes (Unsigned)
   08/27/23  CC:  Chief Complaint  Patient presents with   Cysto    HPI: 68 year old male with a personal history of microscopic hematuria who presents today for cystoscopy.  He underwent CT urogram on 07/2023 that showed just a punctate stone in the right kidney, otherwise no GU pathology appreciated.  Blood pressure 135/79, pulse (!) 58, height 6' (1.829 m), weight 215 lb (97.5 kg). NED. A&Ox3.   No respiratory distress   Abd soft, NT, ND Normal phallus with bilateral descended testicles  Cystoscopy Procedure Note  Patient identification was confirmed, informed consent was obtained, and patient was prepped using Betadine solution.  Lidocaine jelly was administered per urethral meatus.     Pre-Procedure: - Inspection reveals a normal caliber ureteral meatus.  Procedure: The flexible cystoscope was introduced without difficulty - No urethral strictures/lesions are present. - Enlarged prostate  - Elevated bladder neck - Bilateral ureteral orifices identified - Bladder mucosa  reveals multiple (3 or more) discrete areas including on the right lateral bladder wall with slight erythema, what appears to be subtle neovascularity with a slightly textured appearance.  This is concerning for cystitis versus CIS. - No bladder stones - No trabeculation  Retroflexion unremarkable   Post-Procedure: - Patient tolerated the procedure well  Assessment/ Plan:  1. Microscopic hematuria (Primary) See below  CT urogram unremarkable - Urinalysis, Complete - Cytology, urine  2. Lesion of bladder Urine cytology sent today  We do lengthy discussion today about the cystoscopic findings.  He is also able to view these personally.  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  I offered him cystoscopy with bladder biopsy.  Risk of bleeding, infection, damage surrounding  structures including perforation of the bladder were all discussed.  He is unsure whether and like to proceed or not.  He was given information about biopsy.  At minimum, would like to scope him again in 3 months if he elects not to proceed.  All of his questions were answered.    [name], MD

## 2023-08-27 NOTE — Patient Instructions (Signed)
Bladder Biopsy A bladder biopsy is a procedure to remove a small sample of tissue from the bladder. The tissue is checked under a microscope to diagnose or rule out bladder cancer. During a bladder biopsy, your health care provider may guide a long, thin instrument with a lighted camera (cystoscope) through your urethra and into your bladder. This lets your health care provider see the lining of your urethra and bladder and take a tissue sample. If your ureters need to be checked, a longer tube (ureteroscope) may be used instead. Tell your health care provider about: Any allergies you have. All medicines you are taking, including vitamins, herbs, eye drops, creams, and over-the-counter medicines. Any problems you or family members have had with anesthetic medicines. Any bleeding problems you have. Any surgeries you have had. Any medical conditions you have. Whether you are pregnant or may be pregnant. What are the risks? Generally, this is a safe procedure. However, problems may occur, including: Bleeding. Infection. Allergic reactions to medicines. Damage to nearby structures or organs. These may include the urethra, bladder, and ureters. Pain in the abdomen and pain when you urinate. Narrowing of the urethra from scar tissue. Trouble urinating from swelling. What happens before the procedure? Medicines Ask your health care provider about: Changing or stopping your regular medicines. This is especially important if you are taking diabetes medicines or blood thinners. Taking medicines such as aspirin and ibuprofen. These medicines can thin your blood. Do not take these medicines unless your health care provider tells you to take them. Taking over-the-counter medicines, vitamins, herbs, and supplements. Surgery safety Ask your health care provider: How your surgery site will be marked. What steps will be taken to help prevent infection. These steps may include: Removing hair at the  surgery site. Washing skin with a germ-killing soap. Receiving antibiotic medicine. General instructions Follow instructions from your health care provider about eating and drinking restrictions. You may be asked to drink plenty of fluids. You may be asked to urinate right before the procedure. Your urine may be tested for infection. If you will be going home right after the procedure, plan to have a responsible adult: Take you home from the hospital or clinic. You will not be allowed to drive. Care for you for the time you are told. What happens during the procedure?  An IV may be inserted into one of your veins. You may be given one or more of the following: A medicine to help you relax (sedative). A medicine to numb the opening of the urethra (local anesthetic). A medicine to make you fall asleep (general anesthetic). You will lie on your back with your knees bent and spread apart. The cystoscope or ureteroscope will be put into your urethra and guided into your bladder or ureters. Your bladder may be filled with germ-free (sterile) water. This will help your health care provider see the wall or lining of your bladder. Small instruments will be put through the scope to take a tissue sample to look at under a microscope. The procedure may vary among health care providers and hospitals. What happens after the procedure? Your blood pressure, heart rate, breathing rate, and blood oxygen level will be monitored until you leave the hospital or clinic. You may be asked to empty your bladder, or it may be emptied for you. If you were given a sedative during the procedure, it can affect you for several hours. Do not drive or operate machinery until your health care provider says that  it is safe. Summary A bladder biopsy is a procedure to remove a small tissue sample from the bladder. A bladder biopsy can be done to diagnose or rule out bladder cancer. Follow instructions from your health care  provider about what you can eat or drink and about any changes to your medicines. Plan to have a responsible adult take you home and care for you as told. This information is not intended to replace advice given to you by your health care provider. Make sure you discuss any questions you have with your health care provider. Document Revised: 05/21/2021 Document Reviewed: 05/21/2021 Elsevier Patient Education  2024 [employer].

## 2023-08-27 NOTE — H&P (View-Only) (Signed)
   08/27/23  CC:  Chief Complaint  Patient presents with   Cysto    HPI: 68 year old male with a personal history of microscopic hematuria who presents today for cystoscopy.  He underwent CT urogram on 07/2023 that showed just a punctate stone in the right kidney, otherwise no GU pathology appreciated.  Blood pressure 135/79, pulse (!) 58, height 6' (1.829 m), weight 215 lb (97.5 kg). NED. A&Ox3.   No respiratory distress   Abd soft, NT, ND Normal phallus with bilateral descended testicles  Cystoscopy Procedure Note  Patient identification was confirmed, informed consent was obtained, and patient was prepped using Betadine solution.  Lidocaine jelly was administered per urethral meatus.     Pre-Procedure: - Inspection reveals a normal caliber ureteral meatus.  Procedure: The flexible cystoscope was introduced without difficulty - No urethral strictures/lesions are present. - Enlarged prostate  - Elevated bladder neck - Bilateral ureteral orifices identified - Bladder mucosa  reveals multiple (3 or more) discrete areas including on the right lateral bladder wall with slight erythema, what appears to be subtle neovascularity with a slightly textured appearance.  This is concerning for cystitis versus CIS. - No bladder stones - No trabeculation  Retroflexion unremarkable   Post-Procedure: - Patient tolerated the procedure well  Assessment/ Plan:  1. Microscopic hematuria (Primary) See below  CT urogram unremarkable - Urinalysis, Complete - Cytology, urine  2. Lesion of bladder Urine cytology sent today  We do lengthy discussion today about the cystoscopic findings.  He is also able to view these personally.  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  I offered him cystoscopy with bladder biopsy.  Risk of bleeding, infection, damage surrounding  structures including perforation of the bladder were all discussed.  He is unsure whether and like to proceed or not.  He was given information about biopsy.  At minimum, would like to scope him again in 3 months if he elects not to proceed.  All of his questions were answered.    [name], MD

## 2023-08-28 ENCOUNTER — Other Ambulatory Visit: Payer: Self-pay | Admitting: Urology

## 2023-08-28 ENCOUNTER — Other Ambulatory Visit: Payer: Self-pay

## 2023-08-28 DIAGNOSIS — N329 Bladder disorder, unspecified: Secondary | ICD-10-CM

## 2023-08-28 NOTE — Progress Notes (Signed)
 Surgical Physician Order Form [facility] [place]  * Scheduling expectation : Next Available  *Length of Case:   *Clearance needed: no  *Anticoagulation Instructions: N/A  *Aspirin Instructions: Ok to continue Aspirin  *Post-op visit Date/Instructions:   TBD  *Diagnosis: Bladder Lesion  *Procedure:  Cysto Bladder Biopsy (40981)   Additional orders: N/A  -Admit type: OUTpatient  -Anesthesia: Choice  -VTE Prophylaxis Standing Order SCD's       Other:   -Standing Lab Orders Per Anesthesia    Lab other: UA&Urine Culture  -Standing Test orders EKG/Chest x-ray per Anesthesia       Test other:   - Medications:  Ancef 2gm IV  -Other orders:  N/A

## 2023-08-28 NOTE — Progress Notes (Unsigned)
 Surgical Physician Order Form [facility]  Dr. [name], MD  * Scheduling expectation : Next Available  *Length of Case:   *Clearance needed: no  *Anticoagulation Instructions: N/A  *Aspirin Instructions: Ok to continue Aspirin  *Post-op visit Date/Instructions:   TBD  *Diagnosis: Bladder Lesion  *Procedure:  Cysto Bladder Biopsy ([id number])   Additional orders: N/A  -Admit type: OUTpatient  -Anesthesia: Choice  -VTE Prophylaxis Standing Order SCD's       Other:   -Standing Lab Orders Per Anesthesia    Lab other: UA&Urine Culture  -Standing Test orders EKG/Chest x-ray per Anesthesia       Test other:   - Medications:  Ancef 2gm IV  -Other orders:  N/A

## 2023-08-29 ENCOUNTER — Telehealth: Payer: Self-pay

## 2023-08-29 NOTE — Progress Notes (Signed)
   [facility] Surgical Posting Form  Surgery Date: Date: 09/09/2023  Surgeon: Dr. [name], MD  Inpt ( No  )   Outpt (Yes)   Obs ( No  )   Diagnosis: N32.9 Bladder Lesion  -CPT: 52204  Surgery: Cystoscopy with Bladder Biopsy  Stop Anticoagulations: Yes, may continue ASA  Cardiac/Medical/Pulmonary Clearance needed: no  *Orders entered into EPIC  Date: 08/29/23   *Case booked in [place]  Date: 08/29/23  *Notified pt of Surgery: Date: 08/29/23  PRE-OP UA & CX: yes, will obtain in clinic on 08/30/2023  *Placed into Prior Authorization Work Angela Nevin Date: 08/29/23  Assistant/laser/rep:No

## 2023-08-29 NOTE — Telephone Encounter (Signed)
  Per Dr. Apolinar Junes, Patient is to be scheduled for Cystoscopy with Bladder Biopsy   Spencer Smith was contacted and possible surgical dates were discussed, Monday March 17th, 2025 was agreed upon for surgery.   Patient was instructed that Dr. Apolinar Junes will require them to provide a pre-op UA & CX prior to surgery. This was ordered and scheduled drop off appointment was made for 08/30/2023.    Patient was directed to call [phone] between 1-3pm the day before surgery to find out surgical arrival time.  Instructions were given not to eat or drink from midnight on the night before surgery and have a driver for the day of surgery. On the surgery day patient was instructed to enter through the Medical Mall entrance of [facility] report the Same Day Surgery desk.   Pre-Admit Testing will be in contact via phone to set up an interview with the anesthesia team to review your history and medications prior to surgery.   Reminder of this information was sent via MyChart to the patient.

## 2023-08-30 ENCOUNTER — Other Ambulatory Visit

## 2023-08-30 DIAGNOSIS — N329 Bladder disorder, unspecified: Secondary | ICD-10-CM | POA: Diagnosis not present

## 2023-08-30 LAB — MICROSCOPIC EXAMINATION

## 2023-08-30 LAB — URINALYSIS, COMPLETE
Bilirubin, UA: NEGATIVE
Glucose, UA: NEGATIVE
Ketones, UA: NEGATIVE
Leukocytes,UA: NEGATIVE
Nitrite, UA: NEGATIVE
Protein,UA: NEGATIVE
Specific Gravity, UA: 1.025 (ref 1.005–1.030)
Urobilinogen, Ur: 0.2 mg/dL (ref 0.2–1.0)
pH, UA: 6 (ref 5.0–7.5)

## 2023-09-03 ENCOUNTER — Other Ambulatory Visit: Payer: Self-pay

## 2023-09-03 ENCOUNTER — Encounter
Admission: RE | Admit: 2023-09-03 | Discharge: 2023-09-03 | Disposition: A | Discharge status: Home / Self Care | Source: Ambulatory Visit | Attending: Urology | Admitting: Urology

## 2023-09-03 DIAGNOSIS — I10 Essential (primary) hypertension: Secondary | ICD-10-CM

## 2023-09-03 HISTORY — DX: Essential (primary) hypertension: I10

## 2023-09-03 HISTORY — DX: Unspecified osteoarthritis, unspecified site: M19.90

## 2023-09-03 HISTORY — DX: Family history of other specified conditions: Z84.89

## 2023-09-03 LAB — CULTURE, URINE COMPREHENSIVE

## 2023-09-03 NOTE — Patient Instructions (Addendum)
 Your procedure is scheduled on: 09/09/23 - Monday Report to the Registration Desk on the 1st floor of the Medical Mall. To find out your arrival time, please call [phone] between 1PM - 3PM on: 09/06/23 - Friday If your arrival time is 6:00 am, do not arrive before that time as the Medical Mall entrance doors do not open until 6:00 am.  REMEMBER: Instructions that are not followed completely may result in serious medical risk, up to and including death; or upon the discretion of your surgeon and anesthesiologist your surgery may need to be rescheduled.  Do not eat food or drink any liquids after midnight the night before surgery.  No gum chewing or hard candies.   One week prior to surgery: Stop Anti-inflammatories (NSAIDS) such as Advil, Aleve, Ibuprofen, Motrin, Naproxen, Naprosyn and Aspirin based products such as Excedrin, Goody's Powder, BC Powder. You may take Tylenol if needed for pain up until the day of surgery.  Stop ANY OVER THE COUNTER supplements until after surgery : MULTIVITAMIN   ON THE DAY OF SURGERY ONLY TAKE THESE MEDICATIONS WITH SIPS OF WATER:  amLODipine (NORVASC)  FLUoxetine (PROZAC)     No Alcohol for 24 hours before or after surgery.  No Smoking including e-cigarettes for 24 hours before surgery.  No chewable tobacco products for at least 6 hours before surgery.  No nicotine patches on the day of surgery.  Do not use any "recreational" drugs for at least a week (preferably 2 weeks) before your surgery.  Please be advised that the combination of cocaine and anesthesia may have negative outcomes, up to and including death. If you test positive for cocaine, your surgery will be cancelled.  On the morning of surgery brush your teeth with toothpaste and water, you may rinse your mouth with mouthwash if you wish. Do not swallow any toothpaste or mouthwash.  Do not wear jewelry, make-up, hairpins, clips or nail polish.  For welded (permanent) jewelry:  bracelets, anklets, waist bands, etc.  Please have this removed prior to surgery.  If it is not removed, there is a chance that hospital personnel will need to cut it off on the day of surgery.  Do not wear lotions, powders, or perfumes.   Do not shave body hair from the neck down 48 hours before surgery.  Contact lenses, hearing aids and dentures may not be worn into surgery.  Do not bring valuables to the hospital. [facility] is not responsible for any missing/lost belongings or valuables.   Bring your C-PAP to the hospital in case you may have to spend the night.   Notify your doctor if there is any change in your medical condition (cold, fever, infection).  Wear comfortable clothing (specific to your surgery type) to the hospital.  After surgery, you can help prevent lung complications by doing breathing exercises.  Take deep breaths and cough every 1-2 hours. Your doctor may order a device called an Incentive Spirometer to help you take deep breaths. When coughing or sneezing, hold a pillow firmly against your incision with both hands. This is called "splinting." Doing this helps protect your incision. It also decreases belly discomfort.  If you are being admitted to the hospital overnight, leave your suitcase in the car. After surgery it may be brought to your room.  In case of increased patient census, it may be necessary for you, the patient, to continue your postoperative care in the Same Day Surgery department.  If you are being discharged the  day of surgery, you will not be allowed to drive home. You will need a responsible individual to drive you home and stay with you for 24 hours after surgery.   If you are taking public transportation, you will need to have a responsible individual with you.  Please call the Pre-admissions Testing Dept. at [phone] if you have any questions about these instructions.  Surgery Visitation Policy:  Patients having surgery or a  procedure may have two visitors.  Children under the age of 38 must have an adult with them who is not the patient.  Temporary Visitor Restrictions Due to increasing cases of flu, RSV and COVID-19: Children ages 91 and under will not be able to visit patients in [facility] hospitals under most circumstances.  Inpatient Visitation:    Visiting hours are 7 a.m. to 8 p.m. Up to four visitors are allowed at one time in a patient room. The visitors may rotate out with other people during the day.  One visitor age 33 or older may stay with the patient overnight and must be in the room by 8 p.m.

## 2023-09-05 ENCOUNTER — Encounter
Admission: RE | Admit: 2023-09-05 | Discharge: 2023-09-05 | Disposition: A | Discharge status: Home / Self Care | Source: Ambulatory Visit | Attending: Urology | Admitting: Urology

## 2023-09-05 DIAGNOSIS — Z01818 Encounter for other preprocedural examination: Secondary | ICD-10-CM | POA: Insufficient documentation

## 2023-09-05 DIAGNOSIS — I10 Essential (primary) hypertension: Secondary | ICD-10-CM | POA: Insufficient documentation

## 2023-09-05 DIAGNOSIS — Z01810 Encounter for preprocedural cardiovascular examination: Secondary | ICD-10-CM | POA: Diagnosis not present

## 2023-09-05 LAB — CBC
HCT: 40 % (ref 39.0–52.0)
Hemoglobin: 14 g/dL (ref 13.0–17.0)
MCH: 31 pg (ref 26.0–34.0)
MCHC: 35 g/dL (ref 30.0–36.0)
MCV: 88.5 fL (ref 80.0–100.0)
Platelets: 205 10*3/uL (ref 150–400)
RBC: 4.52 MIL/uL (ref 4.22–5.81)
RDW: 12.1 % (ref 11.5–15.5)
WBC: 5.1 10*3/uL (ref 4.0–10.5)
nRBC: 0 % (ref 0.0–0.2)

## 2023-09-08 MED ORDER — LACTATED RINGERS IV SOLN: INTRAVENOUS | Status: DC

## 2023-09-08 MED ORDER — ORAL CARE MOUTH RINSE: 15.0000 mL | Freq: Once | OROMUCOSAL | Status: AC

## 2023-09-08 MED ORDER — CHLORHEXIDINE GLUCONATE 0.12 % MT SOLN
15.0000 mL | Freq: Once | OROMUCOSAL | Status: AC
  Administered 2023-09-09: 15 mL via OROMUCOSAL

## 2023-09-08 MED ORDER — CEFAZOLIN SODIUM-DEXTROSE 2-4 GM/100ML-% IV SOLN
2.0000 g | INTRAVENOUS | Status: AC
  Administered 2023-09-09: 2 g via INTRAVENOUS

## 2023-09-09 ENCOUNTER — Ambulatory Visit: Admitting: Anesthesiology

## 2023-09-09 ENCOUNTER — Ambulatory Visit
Admission: RE | Admit: 2023-09-09 | Discharge: 2023-09-09 | Disposition: A | Discharge status: Home / Self Care | Attending: Urology | Admitting: Urology

## 2023-09-09 ENCOUNTER — Other Ambulatory Visit: Payer: Self-pay

## 2023-09-09 ENCOUNTER — Encounter: Payer: Self-pay | Admitting: Urology

## 2023-09-09 ENCOUNTER — Encounter
Admission: RE | Disposition: A | Discharge status: Home / Self Care | Payer: Self-pay | Source: Home / Self Care | Attending: Urology

## 2023-09-09 DIAGNOSIS — N3289 Other specified disorders of bladder: Secondary | ICD-10-CM | POA: Diagnosis not present

## 2023-09-09 DIAGNOSIS — N329 Bladder disorder, unspecified: Secondary | ICD-10-CM

## 2023-09-09 DIAGNOSIS — G4730 Sleep apnea, unspecified: Secondary | ICD-10-CM | POA: Diagnosis not present

## 2023-09-09 DIAGNOSIS — N400 Enlarged prostate without lower urinary tract symptoms: Secondary | ICD-10-CM | POA: Diagnosis not present

## 2023-09-09 DIAGNOSIS — R3129 Other microscopic hematuria: Secondary | ICD-10-CM | POA: Diagnosis not present

## 2023-09-09 DIAGNOSIS — Z87891 Personal history of nicotine dependence: Secondary | ICD-10-CM | POA: Insufficient documentation

## 2023-09-09 DIAGNOSIS — I10 Essential (primary) hypertension: Secondary | ICD-10-CM | POA: Insufficient documentation

## 2023-09-09 HISTORY — PX: CYSTOSCOPY WITH BIOPSY: SHX5122

## 2023-09-09 SURGERY — CYSTOSCOPY, WITH BIOPSY
Anesthesia: General | Site: Bladder

## 2023-09-09 MED ORDER — CHLORHEXIDINE GLUCONATE 0.12 % MT SOLN
OROMUCOSAL | Status: AC
Start: 2023-09-09 — End: ?
  Filled 2023-09-09: qty 15

## 2023-09-09 MED ORDER — FENTANYL CITRATE (PF) 100 MCG/2ML IJ SOLN: 25.0000 ug | INTRAMUSCULAR | Status: DC | PRN

## 2023-09-09 MED ORDER — ONDANSETRON HCL 4 MG/2ML IJ SOLN
INTRAMUSCULAR | Status: DC | PRN
  Administered 2023-09-09: 4 mg via INTRAVENOUS

## 2023-09-09 MED ORDER — OXYCODONE HCL 5 MG/5ML PO SOLN: 5.0000 mg | Freq: Once | ORAL | Status: DC | PRN

## 2023-09-09 MED ORDER — PROPOFOL 10 MG/ML IV BOLUS
INTRAVENOUS | Status: DC | PRN
  Administered 2023-09-09: 100 mg via INTRAVENOUS

## 2023-09-09 MED ORDER — STERILE WATER FOR IRRIGATION IR SOLN
Status: DC | PRN
  Administered 2023-09-09: 3000 mL

## 2023-09-09 MED ORDER — FENTANYL CITRATE (PF) 100 MCG/2ML IJ SOLN
INTRAMUSCULAR | Status: AC
  Filled 2023-09-09: qty 2

## 2023-09-09 MED ORDER — OXYCODONE HCL 5 MG PO TABS: 5.0000 mg | ORAL_TABLET | Freq: Once | ORAL | Status: DC | PRN

## 2023-09-09 MED ORDER — CEFAZOLIN SODIUM-DEXTROSE 2-4 GM/100ML-% IV SOLN
INTRAVENOUS | Status: AC
  Filled 2023-09-09: qty 100

## 2023-09-09 MED ORDER — PROPOFOL 10 MG/ML IV BOLUS
INTRAVENOUS | Status: AC
  Filled 2023-09-09: qty 20

## 2023-09-09 MED ORDER — DEXAMETHASONE SODIUM PHOSPHATE 10 MG/ML IJ SOLN
INTRAMUSCULAR | Status: DC | PRN
  Administered 2023-09-09: 10 mg via INTRAVENOUS

## 2023-09-09 MED ORDER — LIDOCAINE HCL (CARDIAC) PF 100 MG/5ML IV SOSY
PREFILLED_SYRINGE | INTRAVENOUS | Status: DC | PRN
  Administered 2023-09-09: 100 mg via INTRAVENOUS

## 2023-09-09 MED ORDER — FENTANYL CITRATE (PF) 100 MCG/2ML IJ SOLN
INTRAMUSCULAR | Status: DC | PRN
  Administered 2023-09-09: 100 ug via INTRAVENOUS

## 2023-09-09 SURGICAL SUPPLY — 16 items
BAG DRAIN SIEMENS DORNER NS (MISCELLANEOUS) ×1 IMPLANT
BRUSH SCRUB EZ 4% CHG (MISCELLANEOUS) ×1 IMPLANT
DRSG TELFA 3X4 N-ADH STERILE (GAUZE/BANDAGES/DRESSINGS) ×1 IMPLANT
ELECT REM PT RETURN 9FT ADLT (ELECTROSURGICAL) ×1 IMPLANT
ELECTRODE REM PT RTRN 9FT ADLT (ELECTROSURGICAL) ×1 IMPLANT
GAUZE 4X4 16PLY ~~LOC~~+RFID DBL (SPONGE) ×2 IMPLANT
GLOVE BIO SURGEON STRL SZ 6.5 (GLOVE) ×1 IMPLANT
GOWN STRL REUS W/ TWL LRG LVL3 (GOWN DISPOSABLE) ×2 IMPLANT
KIT TURNOVER CYSTO (KITS) ×1 IMPLANT
NDL SAFETY ECLIPSE 18X1.5 (NEEDLE) ×1 IMPLANT
PACK CYSTO AR (MISCELLANEOUS) ×1 IMPLANT
SET CYSTO W/LG BORE CLAMP LF (SET/KITS/TRAYS/PACK) ×1 IMPLANT
SURGILUBE 2OZ TUBE FLIPTOP (MISCELLANEOUS) ×1 IMPLANT
WATER STERILE IRR 1000ML POUR (IV SOLUTION) ×1 IMPLANT
WATER STERILE IRR 3000ML UROMA (IV SOLUTION) ×1 IMPLANT
WATER STERILE IRR 500ML POUR (IV SOLUTION) ×1 IMPLANT

## 2023-09-09 NOTE — Anesthesia Preprocedure Evaluation (Addendum)
 Anesthesia Evaluation  Patient identified by MRN, date of birth, ID band Patient awake    Reviewed: Allergy & Precautions, NPO status , Patient's Chart, lab work & pertinent test results  History of Anesthesia Complications Negative for: history of anesthetic complications  Airway Mallampati: III  TM Distance: <3 FB Neck ROM: full    Dental  (+) Chipped   Pulmonary neg shortness of breath, sleep apnea , former smoker   Pulmonary exam normal        Cardiovascular Exercise Tolerance: Good hypertension, (-) angina (-) Past MI Normal cardiovascular exam     Neuro/Psych negative neurological ROS  negative psych ROS   GI/Hepatic negative GI ROS, Neg liver ROS,neg GERD  ,,  Endo/Other  negative endocrine ROS    Renal/GU      Musculoskeletal   Abdominal   Peds  Hematology negative hematology ROS (+)   Anesthesia Other Findings Past Medical History: No date: Anxiety No date: Arthritis No date: Essential hypertension, benign No date: Family history of adverse reaction to anesthesia     Comment:  Mother woke up combative No date: History of kidney stones No date: Hypercholesterolemia No date: Hypertension 2025: Lesion of bladder No date: Sleep apnea  Past Surgical History: 2011: COLONOSCOPY 05/26/2020: PROSTATE BIOPSY; N/A     Comment:  Procedure: PROSTATE BIOPSY URONAV;  Surgeon: Orson Ape, MD;  Location: [facility] ORS;  Service: Urology;                Laterality: N/A; 1963: TONSILLECTOMY  BMI    Body Mass Index: 29.16 kg/m      Reproductive/Obstetrics negative OB ROS                             Anesthesia Physical Anesthesia Plan  ASA: 3  Anesthesia Plan: General LMA   Post-op Pain Management:    Induction: Intravenous  PONV Risk Score and Plan: Dexamethasone, Ondansetron, Midazolam and Treatment may vary due to age or medical condition  Airway  Management Planned: LMA  Additional Equipment:   Intra-op Plan:   Post-operative Plan: Extubation in OR  Informed Consent: I have reviewed the patients History and Physical, chart, labs and discussed the procedure including the risks, benefits and alternatives for the proposed anesthesia with the patient or authorized representative who has indicated his/her understanding and acceptance.     Dental Advisory Given  Plan Discussed with: Anesthesiologist, CRNA and Surgeon  Anesthesia Plan Comments: (Patient consented for risks of anesthesia including but not limited to:  - adverse reactions to medications - damage to eyes, teeth, lips or other oral mucosa - nerve damage due to positioning  - sore throat or hoarseness - Damage to heart, brain, nerves, lungs, other parts of body or loss of life  Patient voiced understanding and assent.)       Anesthesia Quick Evaluation

## 2023-09-09 NOTE — Op Note (Signed)
 Date of procedure: 09/09/23  Preoperative diagnosis:  Bladder erythema  Postoperative diagnosis:  Same as above  Procedure: Cystoscopy Bladder biopsy  Surgeon: [name], MD  Anesthesia: General  Complications: None  Intraoperative findings: Prostamegaly with slight intravesical protrusion and hypervascular prostate.  A few small areas on the right lateral bladder wall which appeared more vascular than the surrounding.  Today, there is significant less concern for underlying malignant process.  EBL: minimal  Specimens: bladder bx- right lateral wall  Drains: none  Indication: Spencer Smith is a 68 y.o. patient with bladder erythema concerning for possible underlying tumor.  After reviewing the management options for treatment, he elected to proceed with the above surgical procedure(s). We have discussed the potential benefits and risks of the procedure, side effects of the proposed treatment, the likelihood of the patient achieving the goals of the procedure, and any potential problems that might occur during the procedure or recuperation. Informed consent has been obtained.  Description of procedure:  The patient was taken to the operating room and general anesthesia was induced.  The patient was placed in the dorsal lithotomy position, prepped and draped in the usual sterile fashion, and preoperative antibiotics were administered. A preoperative time-out was performed.   A 21 French scope was advanced per urethra into the bladder.  Notably, the prostate was enlarged and hypervascular with a slightly elevated bladder neck.  There was mild bladder trabeculation.  Attention was turned to the right side where a few areas on the right lateral wall appeared to have more concentrated areas of blood vessels.  This appeared to be less suspicious than was originally felt in the office.  There was not any underlying texture or papillary change which is reassuring.  Cold cup biopsies  were then used to biopsy to such areas.  These were then fulgurated using Bugbee electrocautery.  Hemostasis was excellent.  The bladder was drained.  The patient was cleaned and dried, repositioned in the supine position, reversed from anesthesia, and taken to the PACU in stable condition.  Plan: I will call him with his pathology results.  [name], M.D.

## 2023-09-09 NOTE — Anesthesia Postprocedure Evaluation (Signed)
 Anesthesia Post Note  Patient: Spencer Smith  Procedure(s) Performed: CYSTOSCOPY, WITH BIOPSY (Bladder)  Patient location during evaluation: PACU Anesthesia Type: General Level of consciousness: awake and alert Pain management: pain level controlled Vital Signs Assessment: post-procedure vital signs reviewed and stable Respiratory status: spontaneous breathing, nonlabored ventilation, respiratory function stable and patient connected to nasal cannula oxygen Cardiovascular status: blood pressure returned to baseline and stable Postop Assessment: no apparent nausea or vomiting Anesthetic complications: no   There were no known notable events for this encounter.   Last Vitals:  Vitals:   09/09/23 1315 09/09/23 1330  BP: 122/70 133/80  Pulse: (!) 48 (!) 50  Resp: 13 15  Temp:  (!) 36.2 C  SpO2: 96% 97%    Last Pain:  Vitals:   09/09/23 1330  TempSrc: Temporal  PainSc: 0-No pain                 Cleda Mccreedy Collyn Ribas

## 2023-09-09 NOTE — Transfer of Care (Signed)
 Immediate Anesthesia Transfer of Care Note  Patient: Spencer Smith  Procedure(s) Performed: CYSTOSCOPY, WITH BIOPSY (Bladder)  Patient Location: PACU  Anesthesia Type:General  Level of Consciousness: drowsy and patient cooperative  Airway & Oxygen Therapy: Patient Spontanous Breathing and Patient connected to face mask oxygen  Post-op Assessment: Report given to RN and Post -op Vital signs reviewed and stable  Post vital signs: stable  Last Vitals:  Vitals Value Taken Time  BP 129/76 09/09/23 1254  Temp    Pulse 49 09/09/23 1256  Resp 10 09/09/23 1256  SpO2 93 % 09/09/23 1256  Vitals shown include unfiled device data.  Last Pain:  Vitals:   09/09/23 1059  PainSc: 0-No pain         Complications: No notable events documented.

## 2023-09-09 NOTE — Discharge Instructions (Signed)
Transurethral Resection of Bladder Tumor (TURBT) or Bladder Biopsy ° ° °Definition: ° Transurethral Resection of the Bladder Tumor is a surgical procedure used to diagnose and remove tumors within the bladder. TURBT is the most common treatment for early stage bladder cancer. ° °General instructions: °   ° Your recent bladder surgery requires very little post hospital care but some definite precautions. ° °Despite the fact that no skin incisions were used, the area around the bladder incisions are raw and covered with scabs to promote healing and prevent bleeding. Certain precautions are needed to insure that the scabs are not disturbed over the next 2-4 weeks while the healing proceeds. ° °Because the raw surface inside your bladder and the irritating effects of urine you may expect frequency of urination and/or urgency (a stronger desire to urinate) and perhaps even getting up at night more often. This will usually resolve or improve slowly over the healing period. You may see some blood in your urine over the first 6 weeks. Do not be alarmed, even if the urine was clear for a while. Get off your feet and drink lots of fluids until clearing occurs. If you start to pass clots or don't improve call us. ° °Diet: ° °You may return to your normal diet immediately. Because of the raw surface of your bladder, alcohol, spicy foods, foods high in acid and drinks with caffeine may cause irritation or frequency and should be used in moderation. To keep your urine flowing freely and avoid constipation, drink plenty of fluids during the day (8-10 glasses). Tip: Avoid cranberry juice because it is very acidic. ° °Activity: ° °Your physical activity doesn't need to be restricted. However, if you are very active, you may see some blood in the urine. We suggest that you reduce your activity under the circumstances until the bleeding has stopped. ° °Bowels: ° °It is important to keep your bowels regular during the postoperative  period. Straining with bowel movements can cause bleeding. A bowel movement every other day is reasonable. Use a mild laxative if needed, such as milk of magnesia 2-3 tablespoons, or 2 Dulcolax tablets. Call if you continue to have problems. If you had been taking narcotics for pain, before, during or after your surgery, you may be constipated. Take a laxative if necessary. ° ° ° °Medication: ° °You should resume your pre-surgery medications unless told not to. In addition you may be given an antibiotic to prevent or treat infection. Antibiotics are not always necessary. All medication should be taken as prescribed until the bottles are finished unless you are having an unusual reaction to one of the drugs. ° ° °[facility] °[place], [place] [address] °[phone] ° ° ° ° °

## 2023-09-09 NOTE — Interval H&P Note (Signed)
 History and Physical Interval Note:  09/09/2023 10:58 AM  Spencer Smith  has presented today for surgery, with the diagnosis of Bladder Lesion.  The various methods of treatment have been discussed with the patient and family. After consideration of risks, benefits and other options for treatment, the patient has consented to  Procedure(s): CYSTOSCOPY, WITH BIOPSY (N/A) as a surgical intervention.  The patient's history has been reviewed, patient examined, no change in status, stable for surgery.  I have reviewed the patient's chart and labs.  Questions were answered to the patient's satisfaction.    RRR CTAB  [name]

## 2023-09-10 ENCOUNTER — Encounter: Payer: Self-pay | Admitting: Urology

## 2023-09-10 LAB — SURGICAL PATHOLOGY

## 2023-09-11 ENCOUNTER — Encounter: Payer: Self-pay | Admitting: Urology

## 2023-09-27 ENCOUNTER — Encounter: Payer: Self-pay | Admitting: Urology

## 2023-10-22 ENCOUNTER — Other Ambulatory Visit: Payer: Self-pay | Admitting: Cardiology

## 2023-10-22 DIAGNOSIS — I10 Essential (primary) hypertension: Secondary | ICD-10-CM

## 2023-11-27 ENCOUNTER — Other Ambulatory Visit: Payer: Self-pay

## 2023-11-27 ENCOUNTER — Ambulatory Visit (INDEPENDENT_AMBULATORY_CARE_PROVIDER_SITE_OTHER): Admitting: Urology

## 2023-11-27 VITALS — BP 147/91 | HR 71 | Ht 72.0 in | Wt 212.5 lb

## 2023-11-27 DIAGNOSIS — Z125 Encounter for screening for malignant neoplasm of prostate: Secondary | ICD-10-CM

## 2023-11-27 DIAGNOSIS — R3129 Other microscopic hematuria: Secondary | ICD-10-CM | POA: Diagnosis not present

## 2023-11-27 DIAGNOSIS — N400 Enlarged prostate without lower urinary tract symptoms: Secondary | ICD-10-CM

## 2023-11-27 LAB — MICROSCOPIC EXAMINATION

## 2023-11-27 LAB — URINALYSIS, COMPLETE
Bilirubin, UA: NEGATIVE
Glucose, UA: NEGATIVE
Ketones, UA: NEGATIVE
Nitrite, UA: NEGATIVE
Protein,UA: NEGATIVE
Specific Gravity, UA: 1.01 (ref 1.005–1.030)
Urobilinogen, Ur: 0.2 mg/dL (ref 0.2–1.0)
pH, UA: 6 (ref 5.0–7.5)

## 2023-12-02 NOTE — Progress Notes (Signed)
 Error- patient does not need cysto today.  Apt cancelled.

## 2023-12-07 ENCOUNTER — Other Ambulatory Visit: Payer: Self-pay

## 2023-12-07 ENCOUNTER — Emergency Department

## 2023-12-07 ENCOUNTER — Emergency Department: Admission: EM | Admit: 2023-12-07 | Discharge: 2023-12-07 | Disposition: A | Discharge status: Home / Self Care

## 2023-12-07 DIAGNOSIS — I10 Essential (primary) hypertension: Secondary | ICD-10-CM | POA: Diagnosis not present

## 2023-12-07 DIAGNOSIS — S82031A Displaced transverse fracture of right patella, initial encounter for closed fracture: Secondary | ICD-10-CM | POA: Insufficient documentation

## 2023-12-07 DIAGNOSIS — W010XXA Fall on same level from slipping, tripping and stumbling without subsequent striking against object, initial encounter: Secondary | ICD-10-CM | POA: Diagnosis not present

## 2023-12-07 DIAGNOSIS — W19XXXA Unspecified fall, initial encounter: Secondary | ICD-10-CM | POA: Diagnosis not present

## 2023-12-07 DIAGNOSIS — S80919A Unspecified superficial injury of unspecified knee, initial encounter: Secondary | ICD-10-CM | POA: Diagnosis not present

## 2023-12-07 DIAGNOSIS — M25561 Pain in right knee: Secondary | ICD-10-CM | POA: Insufficient documentation

## 2023-12-07 DIAGNOSIS — S8991XA Unspecified injury of right lower leg, initial encounter: Secondary | ICD-10-CM | POA: Diagnosis present

## 2023-12-07 LAB — CBC WITH DIFFERENTIAL/PLATELET
Abs Immature Granulocytes: 0.03 10*3/uL (ref 0.00–0.07)
Basophils Absolute: 0.1 10*3/uL (ref 0.0–0.1)
Basophils Relative: 1 %
Eosinophils Absolute: 0 10*3/uL (ref 0.0–0.5)
Eosinophils Relative: 1 %
HCT: 39.6 % (ref 39.0–52.0)
Hemoglobin: 13.6 g/dL (ref 13.0–17.0)
Immature Granulocytes: 1 %
Lymphocytes Relative: 12 %
Lymphs Abs: 0.7 10*3/uL (ref 0.7–4.0)
MCH: 30.7 pg (ref 26.0–34.0)
MCHC: 34.3 g/dL (ref 30.0–36.0)
MCV: 89.4 fL (ref 80.0–100.0)
Monocytes Absolute: 0.4 10*3/uL (ref 0.1–1.0)
Monocytes Relative: 7 %
Neutro Abs: 4.9 10*3/uL (ref 1.7–7.7)
Neutrophils Relative %: 78 %
Platelets: 190 10*3/uL (ref 150–400)
RBC: 4.43 MIL/uL (ref 4.22–5.81)
RDW: 12.2 % (ref 11.5–15.5)
WBC: 6.2 10*3/uL (ref 4.0–10.5)
nRBC: 0 % (ref 0.0–0.2)

## 2023-12-07 LAB — BASIC METABOLIC PANEL WITH GFR
Anion gap: 8 (ref 5–15)
BUN: 15 mg/dL (ref 8–23)
CO2: 22 mmol/L (ref 22–32)
Calcium: 8.6 mg/dL — ABNORMAL LOW (ref 8.9–10.3)
Chloride: 107 mmol/L (ref 98–111)
Creatinine, Ser: 0.68 mg/dL (ref 0.61–1.24)
GFR, Estimated: >60 mL/min (ref >60)
Glucose, Bld: 134 mg/dL — ABNORMAL HIGH (ref 70–99)
Potassium: 3.7 mmol/L (ref 3.5–5.1)
Sodium: 137 mmol/L (ref 135–145)

## 2023-12-07 LAB — PROTIME-INR
INR: 1 (ref 0.8–1.2)
Prothrombin Time: 13.2 s (ref 11.4–15.2)

## 2023-12-07 MED ORDER — IBUPROFEN 200 MG PO TABS: 600.0000 mg | ORAL_TABLET | Freq: Three times a day (TID) | ORAL | 2 refills | Status: DC | PRN

## 2023-12-07 MED ORDER — ACETAMINOPHEN 500 MG PO TABS: 1000.0000 mg | ORAL_TABLET | Freq: Four times a day (QID) | ORAL | 2 refills | Status: AC | PRN

## 2023-12-07 MED ORDER — MORPHINE SULFATE (PF) 4 MG/ML IV SOLN
4.0000 mg | Freq: Once | INTRAVENOUS | Status: AC
  Administered 2023-12-07: 4 mg via INTRAVENOUS
  Filled 2023-12-07: qty 1

## 2023-12-07 MED ORDER — ACETAMINOPHEN 500 MG PO TABS
1000.0000 mg | ORAL_TABLET | Freq: Once | ORAL | Status: AC
  Administered 2023-12-07: 1000 mg via ORAL
  Filled 2023-12-07: qty 2

## 2023-12-07 MED ORDER — OXYCODONE HCL 5 MG PO TABS: 5.0000 mg | ORAL_TABLET | Freq: Three times a day (TID) | ORAL | 0 refills | Status: DC | PRN

## 2023-12-07 MED ORDER — ONDANSETRON 4 MG PO TBDP: 4.0000 mg | ORAL_TABLET | Freq: Three times a day (TID) | ORAL | 0 refills | Status: DC | PRN

## 2023-12-07 NOTE — Discharge Instructions (Addendum)
 Your evaluation in the emergency department was notable for a fracture of your kneecap (patella).  It is possible this may require surgery, and I placed a referral for you to see an orthopedic surgeon.  I prescribed you pain medications to use as well, and you should use the knee immobilizer and crutches at all times.  Return to the emergency department with any new or worsening symptoms.

## 2023-12-07 NOTE — ED Provider Notes (Signed)
 [facility] Provider Note    Event Date/Time   First MD Initiated Contact with Patient 12/07/23 [phone]     (approximate)   History   Knee Pain  Pt BIB [facility] from [facility] for a fall. [place], complaining of Right Knee pain. Obvious deformity noted at this time. EMS reports all vitals WNL, Aox4.   HPI Spencer Smith is a 68 y.o. male PMH hypertension, hyperlipidemia presents for right knee pain after fall - Patient slipped and had a mechanical fall this morning, landed directly on his right knee.  Not able to bear weight since that time.  Not on blood thinners.  No head strike or other trauma.     Physical Exam   Triage Vital Signs: ED Triage Vitals  Encounter Vitals Group     BP 12/07/23 0834 126/76     Girls Systolic BP Percentile --      Girls Diastolic BP Percentile --      Boys Systolic BP Percentile --      Boys Diastolic BP Percentile --      Pulse Rate 12/07/23 0834 (!) 54     Resp 12/07/23 0834 16     Temp 12/07/23 0834 98 F (36.7 C)     Temp Source 12/07/23 0834 Oral     SpO2 12/07/23 0834 96 %     Weight 12/07/23 0830 211 lb 10.3 oz (96 kg)     Height 12/07/23 0830 6' (1.829 m)     Head Circumference --      Peak Flow --      Pain Score 12/07/23 0830 0     Pain Loc --      Pain Education --      Exclude from Growth Chart --     Most recent vital signs: Vitals:   12/07/23 0834  BP: 126/76  Pulse: (!) 54  Resp: 16  Temp: 98 F (36.7 C)  SpO2: 96%     General: Awake, no distress.  CV:  Good peripheral perfusion.  Resp:  Normal effort.  RLE:   + Effusion to right knee, ?  Mild deformity, mild overlying skin abrasion, 2+ pedal pulse, wiggles all toes, SI LT. limited range of motion of knee due to pain.  No tenderness in ankle or hip.   ED Results / Procedures / Treatments   Labs (all labs ordered are listed, but only abnormal results are displayed) Labs Reviewed  BASIC METABOLIC  PANEL WITH GFR - Abnormal; Notable for the following components:      Result Value   Glucose, Bld 134 (*)    Calcium  8.6 (*)    All other components within normal limits  CBC WITH DIFFERENTIAL/PLATELET  PROTIME-INR     EKG  N/a   RADIOLOGY X-ray right knee with patellar transverse fracture with displacement.  Radiology report reviewed.    PROCEDURES:  Critical Care performed: No  Procedures   MEDICATIONS ORDERED IN ED: Medications  acetaminophen (TYLENOL) tablet 1,000 mg (1,000 mg Oral Given 12/07/23 0859)  morphine  (PF) 4 MG/ML injection 4 mg (4 mg Intravenous Given 12/07/23 0856)     IMPRESSION / MDM / ASSESSMENT AND PLAN / ED COURSE  I reviewed the triage vital signs and the nursing notes.                              DDX/MDM/AP: Differential  diagnosis includes, but is not limited to, knee fracture, consider dislocation, sprain.  No evidence of other traumatic injuries.  Plan: - X-ray right knee - N.p.o. - screening basic screening labs - Pain control - reassess  Patient's presentation is most consistent with acute presentation with potential threat to life or bodily function.    ED course below.  Workup notable for transverse patellar fracture with displacement.  Pain controlled.  Knee immobilizer placed.  Contact is provided.  Plan for nonweightbearing.  Urgent orthopedic referral placed.  Discharged home with plan for outpatient follow-up.  ED return precautions in place.  Patient agrees with plan.  Clinical Course as of 12/07/23 1027  Sat Dec 07, 2023  1610 CXR: IMPRESSION: Transverse fracture through the patella with redundancy/shortening of the quadriceps tendon. Fracture fragments are distracted by at least 3.9 cm.   [MM]    Clinical Course User Index [MM] Collis Deaner, MD     FINAL CLINICAL IMPRESSION(S) / ED DIAGNOSES   Final diagnoses:  Closed displaced transverse fracture of right patella, initial encounter     Rx / DC Orders    ED Discharge Orders          Ordered    Ambulatory referral to Orthopedic Surgery        12/07/23 0940    acetaminophen (TYLENOL) 500 MG tablet  Every 6 hours PRN        12/07/23 1026    ibuprofen  (MOTRIN  IB) 200 MG tablet  Every 8 hours PRN        12/07/23 1026    oxyCODONE  (ROXICODONE ) 5 MG immediate release tablet  Every 8 hours PRN        12/07/23 1026    ondansetron  (ZOFRAN -ODT) 4 MG disintegrating tablet  Every 8 hours PRN        12/07/23 1026             Note:  This document was prepared using Dragon voice recognition software and may include unintentional dictation errors.   Collis Deaner, MD 12/07/23 1027

## 2023-12-07 NOTE — ED Triage Notes (Signed)
 Pt BIB [facility] from [facility] for a fall. [place] dock, complaining of Right Knee pain. Obvious deformity noted at this time. EMS reports all vitals WNL, Aox4.

## 2023-12-10 ENCOUNTER — Other Ambulatory Visit: Payer: Self-pay | Admitting: Orthopedic Surgery

## 2023-12-10 DIAGNOSIS — S82031D Displaced transverse fracture of right patella, subsequent encounter for closed fracture with routine healing: Secondary | ICD-10-CM | POA: Diagnosis not present

## 2023-12-11 ENCOUNTER — Encounter
Admission: RE | Admit: 2023-12-11 | Discharge: 2023-12-11 | Disposition: A | Discharge status: Home / Self Care | Source: Ambulatory Visit | Attending: Orthopedic Surgery | Admitting: Orthopedic Surgery

## 2023-12-11 ENCOUNTER — Other Ambulatory Visit: Payer: Self-pay

## 2023-12-11 MED ORDER — ORAL CARE MOUTH RINSE
15.0000 mL | Freq: Once | OROMUCOSAL | Status: AC
Start: 2023-12-11 — End: 2023-12-12

## 2023-12-11 MED ORDER — LACTATED RINGERS IV SOLN: INTRAVENOUS | Status: DC

## 2023-12-11 MED ORDER — CHLORHEXIDINE GLUCONATE 0.12 % MT SOLN
15.0000 mL | Freq: Once | OROMUCOSAL | Status: AC
  Administered 2023-12-12: 15 mL via OROMUCOSAL

## 2023-12-11 MED ORDER — CEFAZOLIN SODIUM-DEXTROSE 2-4 GM/100ML-% IV SOLN
2.0000 g | INTRAVENOUS | Status: AC
  Administered 2023-12-12: 2 g via INTRAVENOUS

## 2023-12-11 NOTE — Patient Instructions (Addendum)
 Your procedure is scheduled on:  THURSDAY JUNE 19  Report to the Registration Desk on the 1st floor of the [facility] To find out your arrival time, please call [phone] between 1PM - 3PM on:   [facility] JUNE 18  If your arrival time is 6:00 am, do not arrive before that time as the [facility] entrance doors do not open until 6:00 am.  REMEMBER: Instructions that are not followed completely may result in serious medical risk, up to and including death; or upon the discretion of your surgeon and anesthesiologist your surgery may need to be rescheduled.  Do not eat food after midnight the night before surgery.  No gum chewing or hard candies.  You may however, drink CLEAR liquids up to 2 hours before you are scheduled to arrive for your surgery. Do not drink anything within 2 hours of your scheduled arrival time.  Clear liquids include: - water   - apple juice without pulp - gatorade (not RED colors) - black coffee or tea (Do NOT add milk or creamers to the coffee or tea) Do NOT drink anything that is not on this list.   One week prior to surgery: Stop Anti-inflammatories (NSAIDS) such as Advil , Aleve , Ibuprofen , Motrin , Naproxen , Naprosyn  and Aspirin based products such as Excedrin, Goody's Powder, BC Powder. Stop ANY OVER THE COUNTER supplements until after surgery.  You may however, continue to take Tylenol if needed for pain up until the day of surgery.   Continue taking all of your other prescription medications up until the day of surgery.  ON THE DAY OF SURGERY ONLY TAKE THESE MEDICATIONS WITH SIPS OF WATER :  FLUoxetine  (PROZAC )  penicillin v potassium (VEETID)   No Alcohol for 24 hours before or after surgery.  Do not use any recreational drugs for at least a week (preferably 2 weeks) before your surgery.  Please be advised that the combination of cocaine and anesthesia may have negative outcomes, up to and including death. If you test positive for cocaine,  your surgery will be cancelled.  On the morning of surgery brush your teeth with toothpaste and water , you may rinse your mouth with mouthwash if you wish. Do not swallow any toothpaste or mouthwash.  Use CHG Soap as directed on instruction sheet.  Do not wear jewelry, make-up, hairpins, clips or nail polish.  For welded (permanent) jewelry: bracelets, anklets, waist bands, etc.  Please have this removed prior to surgery.  If it is not removed, there is a chance that hospital personnel will need to cut it off on the day of surgery.  Do not wear lotions, powders, or perfumes.   Do not shave body hair from the neck down 48 hours before surgery.  Do not bring valuables to the hospital. [facility] is not responsible for any missing/lost belongings or valuables.   Notify your doctor if there is any change in your medical condition (cold, fever, infection).  Wear comfortable clothing (specific to your surgery type) to the hospital.  After surgery, you can help prevent lung complications by doing breathing exercises.  Take deep breaths and cough every 1-2 hours.  If you are being discharged the day of surgery, you will not be allowed to drive home. You will need a responsible individual to drive you home and stay with you for 24 hours after surgery.   If you are taking public transportation, you will need to have a responsible individual with you.  Please call the Pre-admissions Testing Dept. at [phone] if you have any questions about these instructions.  Surgery Visitation Policy:  Patients having surgery or a procedure may have two visitors.  Children under the age of 56 must have an adult with them who is not the patient.       Preparing for Surgery with CHLORHEXIDINE  GLUCONATE (CHG) Soap  Chlorhexidine  Gluconate (CHG) Soap  o An antiseptic cleaner that kills germs and bonds with the skin to continue killing germs even after washing  o Used for showering the night  before surgery and morning of surgery  Before surgery, you can play an important role by reducing the number of germs on your skin.  CHG (Chlorhexidine  gluconate) soap is an antiseptic cleanser which kills germs and bonds with the skin to continue killing germs even after washing.  Please do not use if you have an allergy to CHG or antibacterial soaps. If your skin becomes reddened/irritated stop using the CHG.  1. Shower the NIGHT BEFORE SURGERY and the MORNING OF SURGERY with CHG soap.  2. If you choose to wash your hair, wash your hair first as usual with your normal shampoo.  3. After shampooing, rinse your hair and body thoroughly to remove the shampoo.  4. Use CHG as you would any other liquid soap. You can apply CHG directly to the skin and wash gently with a scrungie or a clean washcloth.  5. Apply the CHG soap to your body only from the neck down. Do not use on open wounds or open sores. Avoid contact with your eyes, ears, mouth, and genitals (private parts). Wash face and genitals (private parts) with your normal soap.  6. Wash thoroughly, paying special attention to the area where your surgery will be performed.  7. Thoroughly rinse your body with warm water .  8. Do not shower/wash with your normal soap after using and rinsing off the CHG soap.  9. Pat yourself dry with a clean towel.  10. Wear clean pajamas to bed the night before surgery.  12. Place clean sheets on your bed the night of your first shower and do not sleep with pets.  13. Shower again with the CHG soap on the day of surgery prior to arriving at the hospital.  14. Do not apply any deodorants/lotions/powders.  15. Please wear clean clothes to the hospital.

## 2023-12-12 ENCOUNTER — Ambulatory Visit: Admitting: Urgent Care

## 2023-12-12 ENCOUNTER — Other Ambulatory Visit: Payer: Self-pay | Admitting: Orthopedic Surgery

## 2023-12-12 ENCOUNTER — Other Ambulatory Visit: Payer: Self-pay

## 2023-12-12 ENCOUNTER — Ambulatory Visit

## 2023-12-12 ENCOUNTER — Encounter
Admission: RE | Disposition: A | Discharge status: Home / Self Care | Payer: Self-pay | Source: Home / Self Care | Attending: Orthopedic Surgery

## 2023-12-12 ENCOUNTER — Encounter: Payer: Self-pay | Admitting: Orthopedic Surgery

## 2023-12-12 ENCOUNTER — Ambulatory Visit
Admission: RE | Admit: 2023-12-12 | Discharge: 2023-12-12 | Disposition: A | Discharge status: Home / Self Care | Attending: Orthopedic Surgery | Admitting: Orthopedic Surgery

## 2023-12-12 DIAGNOSIS — G4730 Sleep apnea, unspecified: Secondary | ICD-10-CM | POA: Diagnosis not present

## 2023-12-12 DIAGNOSIS — Z87891 Personal history of nicotine dependence: Secondary | ICD-10-CM | POA: Diagnosis not present

## 2023-12-12 DIAGNOSIS — S82031D Displaced transverse fracture of right patella, subsequent encounter for closed fracture with routine healing: Secondary | ICD-10-CM | POA: Diagnosis present

## 2023-12-12 DIAGNOSIS — I10 Essential (primary) hypertension: Secondary | ICD-10-CM | POA: Insufficient documentation

## 2023-12-12 DIAGNOSIS — S82001D Unspecified fracture of right patella, subsequent encounter for closed fracture with routine healing: Secondary | ICD-10-CM | POA: Diagnosis not present

## 2023-12-12 DIAGNOSIS — S82041A Displaced comminuted fracture of right patella, initial encounter for closed fracture: Secondary | ICD-10-CM | POA: Insufficient documentation

## 2023-12-12 DIAGNOSIS — S82031A Displaced transverse fracture of right patella, initial encounter for closed fracture: Secondary | ICD-10-CM | POA: Diagnosis not present

## 2023-12-12 DIAGNOSIS — W010XXA Fall on same level from slipping, tripping and stumbling without subsequent striking against object, initial encounter: Secondary | ICD-10-CM | POA: Insufficient documentation

## 2023-12-12 DIAGNOSIS — F17210 Nicotine dependence, cigarettes, uncomplicated: Secondary | ICD-10-CM | POA: Diagnosis not present

## 2023-12-12 HISTORY — PX: ORIF PATELLA: SHX5033

## 2023-12-12 SURGERY — OPEN REDUCTION INTERNAL FIXATION (ORIF) PATELLA
Anesthesia: General | Site: Knee | Laterality: Right

## 2023-12-12 MED ORDER — FENTANYL CITRATE (PF) 100 MCG/2ML IJ SOLN
INTRAMUSCULAR | Status: DC | PRN
  Administered 2023-12-12 (×2): 50 ug via INTRAVENOUS

## 2023-12-12 MED ORDER — OXYCODONE HCL 5 MG/5ML PO SOLN: 5.0000 mg | Freq: Once | ORAL | Status: AC | PRN

## 2023-12-12 MED ORDER — FENTANYL CITRATE (PF) 100 MCG/2ML IJ SOLN
INTRAMUSCULAR | Status: AC
  Filled 2023-12-12: qty 2

## 2023-12-12 MED ORDER — BUPIVACAINE-EPINEPHRINE (PF) 0.25% -1:200000 IJ SOLN
INTRAMUSCULAR | Status: AC
  Filled 2023-12-12: qty 30

## 2023-12-12 MED ORDER — 0.9 % SODIUM CHLORIDE (POUR BTL) OPTIME
TOPICAL | Status: DC | PRN
  Administered 2023-12-12: 1000 mL

## 2023-12-12 MED ORDER — CHLORHEXIDINE GLUCONATE 0.12 % MT SOLN
OROMUCOSAL | Status: AC
  Filled 2023-12-12: qty 15

## 2023-12-12 MED ORDER — LIDOCAINE HCL (CARDIAC) PF 100 MG/5ML IV SOSY
PREFILLED_SYRINGE | INTRAVENOUS | Status: DC | PRN
  Administered 2023-12-12: 60 mg via INTRAVENOUS

## 2023-12-12 MED ORDER — ONDANSETRON HCL 4 MG/2ML IJ SOLN
INTRAMUSCULAR | Status: DC | PRN
  Administered 2023-12-12: 4 mg via INTRAVENOUS

## 2023-12-12 MED ORDER — KETOROLAC TROMETHAMINE 30 MG/ML IJ SOLN
INTRAMUSCULAR | Status: AC
  Filled 2023-12-12: qty 1

## 2023-12-12 MED ORDER — PROPOFOL 10 MG/ML IV BOLUS
INTRAVENOUS | Status: AC
  Filled 2023-12-12: qty 20

## 2023-12-12 MED ORDER — MIDAZOLAM HCL 2 MG/2ML IJ SOLN
INTRAMUSCULAR | Status: DC | PRN
  Administered 2023-12-12: 2 mg via INTRAVENOUS

## 2023-12-12 MED ORDER — MIDAZOLAM HCL 2 MG/2ML IJ SOLN
INTRAMUSCULAR | Status: AC
Start: 2023-12-12 — End: 2023-12-12
  Filled 2023-12-12: qty 2

## 2023-12-12 MED ORDER — OXYCODONE HCL 5 MG PO TABS
ORAL_TABLET | ORAL | Status: AC
  Filled 2023-12-12: qty 1

## 2023-12-12 MED ORDER — TRAMADOL HCL 50 MG PO TABS: 50.0000 mg | ORAL_TABLET | Freq: Four times a day (QID) | ORAL | 0 refills | Status: AC | PRN

## 2023-12-12 MED ORDER — KETOROLAC TROMETHAMINE 30 MG/ML IJ SOLN
INTRAMUSCULAR | Status: DC | PRN
  Administered 2023-12-12: 15 mg via INTRAVENOUS

## 2023-12-12 MED ORDER — DEXAMETHASONE SODIUM PHOSPHATE 10 MG/ML IJ SOLN
INTRAMUSCULAR | Status: AC
  Filled 2023-12-12: qty 1

## 2023-12-12 MED ORDER — FENTANYL CITRATE (PF) 100 MCG/2ML IJ SOLN
25.0000 ug | INTRAMUSCULAR | Status: DC | PRN
  Administered 2023-12-12 (×2): 25 ug via INTRAVENOUS
  Administered 2023-12-12: 50 ug via INTRAVENOUS
  Administered 2023-12-12 (×2): 25 ug via INTRAVENOUS

## 2023-12-12 MED ORDER — ONDANSETRON HCL 4 MG/2ML IJ SOLN
INTRAMUSCULAR | Status: AC
  Filled 2023-12-12: qty 2

## 2023-12-12 MED ORDER — SUGAMMADEX SODIUM 200 MG/2ML IV SOLN
INTRAVENOUS | Status: DC | PRN
  Administered 2023-12-12: 200 mg via INTRAVENOUS

## 2023-12-12 MED ORDER — CEPHALEXIN 500 MG PO CAPS: 500.0000 mg | ORAL_CAPSULE | Freq: Four times a day (QID) | ORAL | 0 refills | Status: AC

## 2023-12-12 MED ORDER — DEXAMETHASONE SODIUM PHOSPHATE 10 MG/ML IJ SOLN
INTRAMUSCULAR | Status: DC | PRN
  Administered 2023-12-12: 5 mg via INTRAVENOUS

## 2023-12-12 MED ORDER — BUPIVACAINE-EPINEPHRINE 0.25% -1:200000 IJ SOLN
INTRAMUSCULAR | Status: DC | PRN
  Administered 2023-12-12: 30 mL

## 2023-12-12 MED ORDER — FENTANYL CITRATE (PF) 100 MCG/2ML IJ SOLN
INTRAMUSCULAR | Status: AC
Start: 2023-12-12 — End: 2023-12-12
  Filled 2023-12-12: qty 2

## 2023-12-12 MED ORDER — ASPIRIN EC 81 MG PO TBEC: 81.0000 mg | DELAYED_RELEASE_TABLET | Freq: Two times a day (BID) | ORAL | 0 refills | Status: AC

## 2023-12-12 MED ORDER — ROCURONIUM BROMIDE 100 MG/10ML IV SOLN
INTRAVENOUS | Status: DC | PRN
  Administered 2023-12-12: 20 mg via INTRAVENOUS
  Administered 2023-12-12: 50 mg via INTRAVENOUS

## 2023-12-12 MED ORDER — ONDANSETRON 4 MG PO TBDP
4.0000 mg | ORAL_TABLET | Freq: Three times a day (TID) | ORAL | 0 refills | Status: AC | PRN
Start: 2023-12-12 — End: ?

## 2023-12-12 MED ORDER — PROPOFOL 10 MG/ML IV BOLUS
INTRAVENOUS | Status: DC | PRN
  Administered 2023-12-12: 200 mg via INTRAVENOUS

## 2023-12-12 MED ORDER — PROPOFOL 1000 MG/100ML IV EMUL
INTRAVENOUS | Status: AC
Start: 2023-12-12 — End: 2023-12-12
  Filled 2023-12-12: qty 100

## 2023-12-12 MED ORDER — OXYCODONE HCL 5 MG PO TABS
5.0000 mg | ORAL_TABLET | Freq: Once | ORAL | Status: AC | PRN
  Administered 2023-12-12: 5 mg via ORAL

## 2023-12-12 SURGICAL SUPPLY — 45 items
BIT DRILL 2.6 CANN (BIT) IMPLANT
BNDG ELASTIC 6X5.8 VLCR NS LF (GAUZE/BANDAGES/DRESSINGS) ×1 IMPLANT
CHLORAPREP W/TINT 26 (MISCELLANEOUS) ×1 IMPLANT
COOLER ICEMAN CLASSIC (MISCELLANEOUS) ×1 IMPLANT
CUFF TRNQT CYL 24X4X16.5-23 (TOURNIQUET CUFF) IMPLANT
DERMABOND ADVANCED .7 DNX12 (GAUZE/BANDAGES/DRESSINGS) ×1 IMPLANT
DRAPE C-ARM XRAY 36X54 (DRAPES) ×1 IMPLANT
DRAPE C-ARMOR (DRAPES) ×1 IMPLANT
DRAPE SHEET LG 3/4 BI-LAMINATE (DRAPES) ×1 IMPLANT
DRSG OPSITE POSTOP 4X8 (GAUZE/BANDAGES/DRESSINGS) ×1 IMPLANT
ELECTRODE REM PT RTRN 9FT ADLT (ELECTROSURGICAL) ×1 IMPLANT
FIBERTAPE 2 W/STRL NDL 17 (SUTURE) IMPLANT
GLOVE BIO SURGEON STRL SZ8 (GLOVE) ×1 IMPLANT
GLOVE BIOGEL PI IND STRL 8 (GLOVE) ×1 IMPLANT
GLOVE PI ORTHO PRO STRL 7.5 (GLOVE) ×2 IMPLANT
GLOVE PI ORTHO PRO STRL SZ8 (GLOVE) ×1 IMPLANT
GLOVE SURG SYN 7.5 E (GLOVE) ×1 IMPLANT
GLOVE SURG SYN 7.5 PF PI (GLOVE) ×1 IMPLANT
GOWN SRG XL LVL 3 NONREINFORCE (GOWNS) ×1 IMPLANT
GOWN STRL REUS W/ TWL LRG LVL3 (GOWN DISPOSABLE) ×1 IMPLANT
GOWN STRL REUS W/ TWL XL LVL3 (GOWN DISPOSABLE) ×1 IMPLANT
GUIDEWIRE .045IN 1.14MM (WIRE) IMPLANT
GUIDEWIRE 1.35MM DUAL TROCAR (WIRE) IMPLANT
IMMOB KNEE 24 THIGH 24 443303 (SOFTGOODS) ×1 IMPLANT
KIT TURNOVER KIT A (KITS) ×1 IMPLANT
MANIFOLD NEPTUNE II (INSTRUMENTS) ×1 IMPLANT
MAT ABSORB FLUID 56X50 GRAY (MISCELLANEOUS) ×1 IMPLANT
NDL HYPO 21X1.5 SAFETY (NEEDLE) ×1 IMPLANT
NEEDLE HYPO 21X1.5 SAFETY (NEEDLE) ×1 IMPLANT
NS IRRIG 500ML POUR BTL (IV SOLUTION) ×1 IMPLANT
PACK TOTAL KNEE (MISCELLANEOUS) ×1 IMPLANT
PAD ARMBOARD POSITIONER FOAM (MISCELLANEOUS) ×2 IMPLANT
PAD COLD UNI WRAP-ON (PAD) ×1 IMPLANT
PENCIL SMOKE EVACUATOR (MISCELLANEOUS) ×1 IMPLANT
SCREW 4X36MM CANN LO-PRO (Screw) IMPLANT
SLEEVE SCD COMPRESS KNEE MED (STOCKING) ×1 IMPLANT
SUT STRATAFIX 14 PDO 36 VLT (SUTURE) ×1 IMPLANT
SUT VIC AB 0 CT1 36 (SUTURE) ×1 IMPLANT
SUT VIC AB 2-0 CT2 27 (SUTURE) ×2 IMPLANT
SUTURE STRATA SPIR 4-0 18 (SUTURE) IMPLANT
SUTURE VICRYL 1-0 27IN ABS (SUTURE) ×1 IMPLANT
SYR 20ML LL LF (SYRINGE) ×1 IMPLANT
SYR BULB IRRIG 60ML STRL (SYRINGE) ×1 IMPLANT
TRAP FLUID SMOKE EVACUATOR (MISCELLANEOUS) ×1 IMPLANT
WATER STERILE IRR 500ML POUR (IV SOLUTION) ×1 IMPLANT

## 2023-12-12 NOTE — Transfer of Care (Signed)
 Immediate Anesthesia Transfer of Care Note  Patient: ERCIL CASSIS  Procedure(s) Performed: OPEN REDUCTION INTERNAL FIXATION (ORIF) PATELLA (Right: Knee)  Patient Location: PACU  Anesthesia Type:General  Level of Consciousness: drowsy  Airway & Oxygen Therapy: Patient Spontanous Breathing and Patient connected to face mask oxygen  Post-op Assessment: Report given to RN and Post -op Vital signs reviewed and stable  Post vital signs: Reviewed and stable  Last Vitals:  Vitals Value Taken Time  BP 143/78 12/12/23 14:00  Temp    Pulse 68 12/12/23 14:02  Resp 17 12/12/23 14:02  SpO2 99 % 12/12/23 14:02  Vitals shown include unfiled device data.  Last Pain:  Vitals:   12/12/23 1138  TempSrc: Temporal         Complications: No notable events documented.

## 2023-12-12 NOTE — Op Note (Signed)
 Patient Name: Spencer Smith  MRN: [id number]  Pre-Operative Diagnosis: Right knee displaced comminuted patella fracture  Post-Operative Diagnosis: Right knee displaced comminuted patella fracture  Procedure: Right patella open reduction internal fixation with partial lateral distal pole patellectomy  Components/Implants: none  Date of Surgery: 12/12/2023  Surgeon: Venus Ginsberg MD  Assistant: Francenia Ingle PA (present and scrubbed throughout the case, critical for assistance with exposure, retraction, instrumentation, and closure), Abraham Hoffmann PAs   Anesthesiologist: Joline Ned  Anesthesia: General    Tourniquet Time: 58 min  EBL: 25cc  Complications: None   Brief history: The patient is a 68 year old male who sustained a right displaced patella fracture this past weekend after falling [place] boat [place].  The injury was closed with a small anterior eschar over the knee.  The risks and benefits of open fixation versus conservative treatment in an immobilizer were discussed with the patient and the patient elected to proceed with surgical intervention. After outpatient optimization was completed the patient was admitted to [facility] for the procedure.  All preoperative films were reviewed and an appropriate surgical plan was made prior to surgery.   Description of procedure: The patient was brought to the operating room where laterality was confirmed by all those present to be the right side.   General anesthesia was administered and the patient received an intravenous dose of antibiotics for surgical prophylaxis.  Patient is positioned supine on the operating room table with all bony prominences well-padded.  A well-padded tourniquet was applied to the right thigh.  The knee was then prepped and draped in usual sterile fashion with multiple layers of adhesive and nonadhesive drapes.  All of those present in the operating room participated in a surgical timeout laterality  and patient were confirmed.   An Esmarch was wrapped around the extremity and the leg was elevated and the knee elevated.  The tourniquet was inflated to a pressure of 250 mmHg.  The patella and tibial tubercle identified and outlined using a marking pen and a midline skin incision was made cheating slightly medial to avoid the eschar with a knife carried through the subcutaneous tissue down to the extensor retinaculum.  Upon reaching the retinaculum there was a large gush of old appearing hematoma/seroma which was evacuated from the knee.  There was found to be complete displacement of a transverse mid pole patella fracture and an exposed knee joint.  Small medial lateral flaps were made and retinacular tears were identified both medially and laterally off the mid pole of the patella.   The knee was irrigated with saline to washout all of the old hematoma and clean up fractured ends of the patella.  There was significant comminution noted at the inferior lateral corner of the patella however the medial two thirds of the patella inferiorly were 1 complete unit with no fragmentation.  There were 2 small fragments laterally which were carefully removed as they were too small to capture with any hardware and there were free-floating pieces of cartilage attached to them.  After reducing the fracture with pointed reduction forceps orthogonal imaging showed a concentric reduction and the volar surface was found to have good cartilage alignment with no step-off.  2 wires were placed from the inferior to superior pole of the patella and checked on orthogonal imaging to be within bone.  A overdrilled was then used to drill over the wires and 2 cannulated 4mm partially-threaded screws were advanced on fluoroscopic imaging compressing the fracture without malreduction.  The  reduction forceps were then removed and the fracture maintained alignment on imaging.  A fiber tape was then passed in figure-of-eight formation through  the cannulated screws and over the anterior surface of the patella.  The tape was tied at the inferior medial corner and the knot was buried beneath the extensor retinaculum.  The knee was taken through a careful range of motion and there was found to be no gapping of the fracture site up to 30 degrees of flexion.  The knee was not taken past this point at this time.  The knee was then irrigated with saline.  Soft tissue's were injected with Marcaine.  The retinacular tears medial and laterally were repaired with figure-of-eight #1 Vicryl suture.  Epitendinous anterior sutures were also placed in figure-of-eight style closing down the soft tissue over the anterior patella.  Superficial space was then irrigated again and the knee joint was found to be watertight.   The tourniquet was then dropped and all bleeding vessels were identified and coagulated. The knee was  closed with 0 Vicryl, 2-0 Vicryl and a running subcuticular 4-0 STRATAFIX suture.  Skin was then glued with Dermabond.  A sterile adhesive dressing was then placed along with a sequential compression device to the calf, an ace wrap with ABDs and a Knee immobilizer.[name]   Sponge, needle, and Lap counts were all correct at the end of the case.   The patient was transferred off of the operating room table to a hospital bed, good pulses were found distally on the operative side.  The patient was transferred to the recovery room in stable condition.

## 2023-12-12 NOTE — Anesthesia Procedure Notes (Signed)
 Procedure Name: Intubation Date/Time: 12/12/2023 12:08 PM  Performed by: Bill Budd, CRNAPre-anesthesia Checklist: Patient identified, Patient being monitored, Timeout performed, Emergency Drugs available and Suction available Patient Re-evaluated:Patient Re-evaluated prior to induction Oxygen Delivery Method: Circle system utilized Preoxygenation: Pre-oxygenation with 100% oxygen Induction Type: IV induction Ventilation: Mask ventilation without difficulty Laryngoscope Size: McGrath and 4 Grade View: Grade I Tube type: Oral Tube size: 7.5 mm Number of attempts: 1 Airway Equipment and Method: Stylet Placement Confirmation: ETT inserted through vocal cords under direct vision, positive ETCO2 and breath sounds checked- equal and bilateral Secured at: 21 cm Tube secured with: Tape Dental Injury: Teeth and Oropharynx as per pre-operative assessment

## 2023-12-12 NOTE — Interval H&P Note (Signed)
 Patient history and physical updated. Consent reviewed including risks, benefits, and alternatives to surgery. Patient agrees with above plan to proceed with right knee open reduction internal fixation patella.

## 2023-12-12 NOTE — Discharge Instructions (Addendum)
 Instructions after Patella Open Reduction and Internal Fixation  Venus Ginsberg M.D.     Dept. of Orthopaedics & Sports Medicine  [facility]  [address]  [place], [place]  [address]  Phone: [phone]   Fax: [phone]    DIET: Drink plenty of non-alcoholic fluids. Resume your normal diet. Include foods high in fiber.  ACTIVITY:  You may use crutches or a walker to ambulate. You are weight-bearing as tolerated on your right lower extremity with knee immobilizer intact. Do not walk or stand with out knee immobilizer on knee.   Keep your operative knee straight at all times. DO NOT BEND YOUR KNEE  Do not drive or operate any equipment until instructed.  WOUND CARE:  use ice packs periodically to reduce pain and swelling. You may remove knee immobilizer, ace wrap, and begin showering with honeycomb dressing 3 days after surgery. Keep knee straight at all times. Reapply knee immobilizer immediately after showering. We will remove honeycomb dressing at your 2 week post op visit.   MEDICATIONS: You may resume your regular medications. Please take the pain medication as prescribed on the medication. Do not take pain medication on an empty stomach. Do not drive or drink alcoholic beverages when taking pain medications. Take aspirin 81 mg oral twice daily for 4 weeks to prevent blood clots.  POSTOPERATIVE CONSTIPATION PROTOCOL Constipation - defined medically as fewer than three stools per week and severe constipation as less than one stool per week.  One of the most common issues patients have following surgery is constipation.  Even if you have a regular bowel pattern at home, your normal regimen is likely to be disrupted due to multiple reasons following surgery.  Combination of anesthesia, postoperative narcotics, change in appetite and fluid intake all can affect your bowels.  In order to avoid complications following surgery, here are some recommendations in order  to help you during your recovery period.  Colace (docusate) - Pick up an over-the-counter form of Colace or another stool softener and take twice a day as long as you are requiring postoperative pain medications.  Take with a full glass of water  daily.  If you experience loose stools or diarrhea, hold the colace until you stool forms back up.  If your symptoms do not get better within 1 week or if they get worse, check with your doctor.  Dulcolax (bisacodyl) - Pick up over-the-counter and take as directed by the product packaging as needed to assist with the movement of your bowels.  Take with a full glass of water .  Use this product as needed if not relieved by Colace only.   MiraLax (polyethylene glycol) - Pick up over-the-counter to have on hand.  MiraLax is a solution that will increase the amount of water  in your bowels to assist with bowel movements.  Take as directed and can mix with a glass of water , juice, soda, coffee, or tea.  Take if you go more than two days without a movement. Do not use MiraLax more than once per day. Call your doctor if you are still constipated or irregular after using this medication for 7 days in a row.  If you continue to have problems with postoperative constipation, please contact the office for further assistance and recommendations.  If you experience the worst abdominal pain ever or develop nausea or vomiting, please contact the office immediatly for further recommendations for treatment.   CALL THE OFFICE FOR: Temperature above 101 degrees Excessive bleeding or drainage  on the dressing. Excessive swelling, coldness, or paleness of the toes. Persistent nausea and vomiting.  FOLLOW-UP:  You should have an appointment to return to the office in 14 days after surgery.
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
## 2023-12-12 NOTE — Anesthesia Postprocedure Evaluation (Signed)
 Anesthesia Post Note  Patient: Spencer Smith  Procedure(s) Performed: OPEN REDUCTION INTERNAL FIXATION (ORIF) PATELLA (Right: Knee)  Patient location during evaluation: PACU Anesthesia Type: General Level of consciousness: awake and alert Pain management: pain level controlled Vital Signs Assessment: post-procedure vital signs reviewed and stable Respiratory status: spontaneous breathing, nonlabored ventilation, respiratory function stable and patient connected to nasal cannula oxygen Cardiovascular status: blood pressure returned to baseline and stable Postop Assessment: no apparent nausea or vomiting Anesthetic complications: no   No notable events documented.   Last Vitals:  Vitals:   12/12/23 1440 12/12/23 1445  BP:    Pulse: 67 71  Resp: 11 14  Temp:    SpO2: 92% (!) 86%    Last Pain:  Vitals:   12/12/23 1440  TempSrc:   PainSc: Asleep                 Nancey Awkward

## 2023-12-12 NOTE — Anesthesia Preprocedure Evaluation (Signed)
 Anesthesia Evaluation  Patient identified by MRN, date of birth, ID band Patient awake    Reviewed: Allergy & Precautions, NPO status , Patient's Chart, lab work & pertinent test results  History of Anesthesia Complications Negative for: history of anesthetic complications  Airway Mallampati: III  TM Distance: <3 FB Neck ROM: full    Dental  (+) Chipped   Pulmonary neg shortness of breath, sleep apnea , former smoker   Pulmonary exam normal        Cardiovascular Exercise Tolerance: Good hypertension, (-) angina (-) Past MI Normal cardiovascular exam     Neuro/Psych negative neurological ROS  negative psych ROS   GI/Hepatic negative GI ROS, Neg liver ROS,neg GERD  ,,  Endo/Other  negative endocrine ROS    Renal/GU      Musculoskeletal   Abdominal   Peds  Hematology negative hematology ROS (+)   Anesthesia Other Findings Past Medical History: No date: Anxiety No date: Arthritis No date: Essential hypertension, benign No date: Family history of adverse reaction to anesthesia     Comment:  Mother woke up combative No date: History of kidney stones No date: Hypercholesterolemia No date: Hypertension 2025: Lesion of bladder No date: Sleep apnea  Past Surgical History: 2011: COLONOSCOPY 05/26/2020: PROSTATE BIOPSY; N/A     Comment:  Procedure: PROSTATE BIOPSY URONAV;  Surgeon: Rea Cambridge, MD;  Location: [facility] ORS;  Service: Urology;                Laterality: N/A; 1963: TONSILLECTOMY  BMI    Body Mass Index: 29.16 kg/m      Reproductive/Obstetrics negative OB ROS                              Anesthesia Physical Anesthesia Plan  ASA: 3  Anesthesia Plan: General LMA   Post-op Pain Management:    Induction: Intravenous  PONV Risk Score and Plan: Dexamethasone , Ondansetron , Midazolam  and Treatment may vary due to age or medical condition  Airway  Management Planned: Oral ETT  Additional Equipment:   Intra-op Plan:   Post-operative Plan: Extubation in OR  Informed Consent: I have reviewed the patients History and Physical, chart, labs and discussed the procedure including the risks, benefits and alternatives for the proposed anesthesia with the patient or authorized representative who has indicated his/her understanding and acceptance.     Dental Advisory Given  Plan Discussed with: Anesthesiologist, CRNA and Surgeon  Anesthesia Plan Comments: (Patient consented for risks of anesthesia including but not limited to:  - adverse reactions to medications - damage to eyes, teeth, lips or other oral mucosa - nerve damage due to positioning  - sore throat or hoarseness - Damage to heart, brain, nerves, lungs, other parts of body or loss of life  Patient voiced understanding and assent.)        Anesthesia Quick Evaluation

## 2023-12-13 ENCOUNTER — Encounter: Payer: Self-pay | Admitting: Orthopedic Surgery

## 2023-12-31 DIAGNOSIS — S82031D Displaced transverse fracture of right patella, subsequent encounter for closed fracture with routine healing: Secondary | ICD-10-CM | POA: Diagnosis not present

## 2023-12-31 DIAGNOSIS — Z8781 Personal history of (healed) traumatic fracture: Secondary | ICD-10-CM | POA: Diagnosis not present

## 2023-12-31 DIAGNOSIS — Z9889 Other specified postprocedural states: Secondary | ICD-10-CM | POA: Diagnosis not present

## 2023-12-31 DIAGNOSIS — M25561 Pain in right knee: Secondary | ICD-10-CM | POA: Diagnosis not present

## 2024-01-06 ENCOUNTER — Encounter: Payer: Self-pay | Admitting: Internal Medicine

## 2024-01-06 ENCOUNTER — Ambulatory Visit (INDEPENDENT_AMBULATORY_CARE_PROVIDER_SITE_OTHER): Payer: Medicare Other | Admitting: Internal Medicine

## 2024-01-06 VITALS — BP 134/80 | HR 61 | Ht 72.0 in | Wt 212.0 lb

## 2024-01-06 DIAGNOSIS — I10 Essential (primary) hypertension: Secondary | ICD-10-CM

## 2024-01-06 DIAGNOSIS — G4733 Obstructive sleep apnea (adult) (pediatric): Secondary | ICD-10-CM | POA: Diagnosis not present

## 2024-01-06 DIAGNOSIS — E782 Mixed hyperlipidemia: Secondary | ICD-10-CM | POA: Diagnosis not present

## 2024-01-06 DIAGNOSIS — R7309 Other abnormal glucose: Secondary | ICD-10-CM

## 2024-01-06 NOTE — Progress Notes (Signed)
 Established Patient Office Visit  Subjective:  Patient ID: Spencer Smith, male    DOB: [birth year]  Age: 68 y.o. MRN: [id number]  Chief Complaint  Patient presents with   Follow-up    6 month follow up    Patient comes in for his follow-up today.  Patient underwent an ORIF of right patella after suffering a transverse fracture from a fall on 12/07/2023. He is now wearing a knee hinged brace.  The wound is healing nicely and he is not in significant pain. Feels well otherwise.  No new complaints.  Denies chest pain or shortness of breath, no palpitations, no headaches and no dizziness.    No other concerns at this time.   Past Medical History:  Diagnosis Date   Anxiety    Arthritis    Benign prostatic hyperplasia without lower urinary tract symptoms 01/04/2023   Essential hypertension, benign    History of kidney stones    Hypercholesterolemia    Hypertension    Lesion of bladder 2025   OSA on CPAP 08/07/2022   Sleep apnea     Past Surgical History:  Procedure Laterality Date   COLONOSCOPY  2011   CYSTOSCOPY WITH BIOPSY N/A 09/09/2023   Procedure: CYSTOSCOPY, WITH BIOPSY;  Surgeon: Penne Knee, MD;  Location: [facility];  Service: Urology;  Laterality: N/A;   ORIF PATELLA Right 12/12/2023   Procedure: OPEN REDUCTION INTERNAL FIXATION (ORIF) PATELLA;  Surgeon: Lorelle Hussar, MD;  Location: [facility];  Service: Orthopedics;  Laterality: Right;   PROSTATE BIOPSY N/A 05/26/2020   Procedure: PROSTATE BIOPSY GRAYCE;  Surgeon: Kassie Ozell SAUNDERS, MD;  Location: [facility];  Service: Urology;  Laterality: N/A;   TONSILLECTOMY  1963    Social History   Socioeconomic History   Marital status: Married    Spouse name: Delon   Number of children: Not on file   Years of education: Not on file   Highest education level: Not on file  Occupational History   Occupation: retired  Tobacco Use   Smoking status: Former    Current packs/day: 0.00    Types: Cigarettes    Quit  date: 2019    Years since quitting: 6.5   Smokeless tobacco: Never  Vaping Use   Vaping status: Never Used  Substance and Sexual Activity   Alcohol use: Not on file    Comment: none since 2019   Drug use: Not Currently    Types: Marijuana    Comment: several years ago   Sexual activity: Not on file  Other Topics Concern   Not on file  Social History Narrative   Not on file   Social Drivers of Health   Financial Resource Strain: Low Risk  (01/01/2024)   Received from [facility]   Overall Financial Resource Strain (CARDIA)    Difficulty of Paying Living Expenses: Not hard at all  Food Insecurity: No Food Insecurity (01/01/2024)   Received from [facility]   Hunger Vital Sign    Within the past 12 months, you worried that your food would run out before you got the money to buy more.: Never true    Within the past 12 months, the food you bought just didn't last and you didn't have money to get more.: Never true  Transportation Needs: No Transportation Needs (01/01/2024)   Received from [facility] - Transportation    In the past 12 months, has lack of transportation kept you  from medical appointments or from getting medications?: No    Lack of Transportation (Non-Medical): No  Physical Activity: Not on file  Stress: No Stress Concern Present (07/08/2023)   [employer] of [employer]ational [employer] - Occupational Stress Questionnaire    Feeling of Stress : Not at all  Social Connections: Not on file  Intimate Partner Violence: Not At Risk (07/08/2023)   Humiliation, Afraid, Rape, and Kick questionnaire    Fear of Current or Ex-Partner: No    Emotionally Abused: No    Physically Abused: No    Sexually Abused: No    Family History  Problem Relation Age of Onset   Heart disease Mother    Dementia Father     Allergies  Allergen Reactions   Alpha-Gal Hives, Itching and Rash    Outpatient Medications Prior to Visit   Medication Sig   acetaminophen  (TYLENOL ) 500 MG tablet Take 2 tablets (1,000 mg total) by mouth every 6 (six) hours as needed.   amLODipine  (NORVASC ) 5 MG tablet TAKE 1 TABLET BY MOUTH DAILY   aspirin  EC 81 MG tablet Take 1 tablet (81 mg total) by mouth 2 (two) times daily for 28 days. Swallow whole.   Calcium  Carbonate (CALCIUM  600 PO) Take 600 mg by mouth daily.   cholecalciferol (VITAMIN D3) 25 MCG (1000 UNIT) tablet Take 1,000 Units by mouth daily.   FLUoxetine  (PROZAC ) 40 MG capsule TAKE 1 CAPSULE BY MOUTH DAILY   Multiple Vitamin (MULTIVITAMIN WITH MINERALS) TABS tablet Take 1 tablet by mouth daily.   NON FORMULARY Pt uses a cpap nightly   ondansetron  (ZOFRAN -ODT) 4 MG disintegrating tablet Take 1 tablet (4 mg total) by mouth every 8 (eight) hours as needed for nausea or vomiting.   traMADol  (ULTRAM ) 50 MG tablet Take 1 tablet (50 mg total) by mouth every 6 (six) hours as needed.   oxyCODONE  (ROXICODONE ) 5 MG immediate release tablet Take 1 tablet (5 mg total) by mouth every 8 (eight) hours as needed for breakthrough pain. (Patient not taking: Reported on 01/06/2024)   penicillin v potassium (VEETID) 500 MG tablet Take 500 mg by mouth 4 (four) times daily. (Patient not taking: Reported on 01/06/2024)   No facility-administered medications prior to visit.    Review of Systems  Constitutional: Negative.  Negative for chills, diaphoresis, fever, malaise/fatigue and weight loss.  HENT: Negative.  Negative for sore throat.   Eyes: Negative.   Respiratory: Negative.  Negative for cough and shortness of breath.   Cardiovascular: Negative.  Negative for chest pain, palpitations and leg swelling.  Gastrointestinal: Negative.  Negative for abdominal pain, constipation, diarrhea, heartburn, nausea and vomiting.  Genitourinary: Negative.  Negative for dysuria and flank pain.  Musculoskeletal:  Positive for joint pain. Negative for myalgias.  Skin: Negative.   Neurological: Negative.  Negative  for dizziness, tingling, tremors and headaches.  Endo/Heme/Allergies: Negative.   Psychiatric/Behavioral: Negative.  Negative for depression and suicidal ideas. The patient is not nervous/anxious.        Objective:   BP 134/80   Pulse 61   Ht 6' (1.829 m)   Wt 212 lb (96.2 kg)   SpO2 94%   BMI 28.75 kg/m   Vitals:   01/06/24 0900  BP: 134/80  Pulse: 61  Height: 6' (1.829 m)  Weight: 212 lb (96.2 kg)  SpO2: 94%  BMI (Calculated): 28.75    Physical Exam Vitals and nursing note reviewed.  Constitutional:      Appearance: Normal appearance.  HENT:  Head: Normocephalic and atraumatic.     Nose: Nose normal.     Mouth/Throat:     Mouth: Mucous membranes are moist.     Pharynx: Oropharynx is clear.  Eyes:     Conjunctiva/sclera: Conjunctivae normal.     Pupils: Pupils are equal, round, and reactive to light.  Cardiovascular:     Rate and Rhythm: Normal rate and regular rhythm.     Pulses: Normal pulses.     Heart sounds: Normal heart sounds.  Pulmonary:     Effort: Pulmonary effort is normal.     Breath sounds: Normal breath sounds.  Abdominal:     General: Bowel sounds are normal.     Palpations: Abdomen is soft.  Musculoskeletal:        General: Normal range of motion.     Cervical back: Normal range of motion.  Skin:    General: Skin is warm and dry.  Neurological:     General: No focal deficit present.     Mental Status: He is alert and oriented to person, place, and time.  Psychiatric:        Mood and Affect: Mood normal.        Behavior: Behavior normal.        Judgment: Judgment normal.      No results found for any visits on 01/06/24.  Recent Results (from the past 2160 hours)  Urinalysis, Complete     Status: Abnormal   Collection Time: 11/27/23  9:35 AM  Result Value Ref Range   Specific Gravity, UA 1.010 1.005 - 1.030   pH, UA 6.0 5.0 - 7.5   Color, UA Yellow Yellow   Appearance Ur Clear Clear   Leukocytes,UA 1+ (A) Negative    Protein,UA Negative Negative/Trace   Glucose, UA Negative Negative   Ketones, UA Negative Negative   RBC, UA 1+ (A) Negative   Bilirubin, UA Negative Negative   Urobilinogen, Ur 0.2 0.2 - 1.0 mg/dL   Nitrite, UA Negative Negative   Microscopic Examination See below:     Comment: Microscopic was indicated and was performed.  Microscopic Examination     Status: Abnormal   Collection Time: 11/27/23  9:35 AM   Urine  Result Value Ref Range   WBC, UA 11-30 (A) 0 - 5 /hpf   RBC, Urine 3-10 (A) 0 - 2 /hpf   Epithelial Cells (non renal) 0-10 0 - 10 /hpf   Mucus, UA Present (A) Not Estab.   Bacteria, UA Few None seen/Few  CBC with Differential     Status: None   Collection Time: 12/07/23  8:50 AM  Result Value Ref Range   WBC 6.2 4.0 - 10.5 K/uL   RBC 4.43 4.22 - 5.81 MIL/uL   Hemoglobin 13.6 13.0 - 17.0 g/dL   HCT 60.3 60.9 - 47.9 %   MCV 89.4 80.0 - 100.0 fL   MCH 30.7 26.0 - 34.0 pg   MCHC 34.3 30.0 - 36.0 g/dL   RDW 87.7 88.4 - 84.4 %   Platelets 190 150 - 400 K/uL   nRBC 0.0 0.0 - 0.2 %   Neutrophils Relative % 78 %   Neutro Abs 4.9 1.7 - 7.7 K/uL   Lymphocytes Relative 12 %   Lymphs Abs 0.7 0.7 - 4.0 K/uL   Monocytes Relative 7 %   Monocytes Absolute 0.4 0.1 - 1.0 K/uL   Eosinophils Relative 1 %   Eosinophils Absolute 0.0 0.0 - 0.5 K/uL   Basophils Relative  1 %   Basophils Absolute 0.1 0.0 - 0.1 K/uL   Immature Granulocytes 1 %   Abs Immature Granulocytes 0.03 0.00 - 0.07 K/uL    Comment: Performed at [facility], [address] [place], [place] [address]  Basic metabolic panel     Status: Abnormal   Collection Time: 12/07/23  8:50 AM  Result Value Ref Range   Sodium 137 135 - 145 mmol/L   Potassium 3.7 3.5 - 5.1 mmol/L   Chloride 107 98 - 111 mmol/L   CO2 22 22 - 32 mmol/L   Glucose, Bld 134 (H) 70 - 99 mg/dL    Comment: Glucose reference range applies only to samples taken after fasting for at least 8 hours.   BUN 15 8 - 23 mg/dL   Creatinine, Ser 9.31  0.61 - 1.24 mg/dL   Calcium  8.6 (L) 8.9 - 10.3 mg/dL   GFR, Estimated >39 >39 mL/min    Comment: (NOTE) Calculated using the CKD-EPI Creatinine Equation (2021)    Anion gap 8 5 - 15    Comment: Performed at [facility], [address] [place], [place] [address]  Protime-INR     Status: None   Collection Time: 12/07/23  8:50 AM  Result Value Ref Range   Prothrombin Time 13.2 11.4 - 15.2 seconds   INR 1.0 0.8 - 1.2    Comment: (NOTE) INR goal varies based on device and disease states. Performed at [facility], [address] [place], [place] [address]       Assessment & Plan:  Continue current medications.  Check labs today.  Continue care under orthopedic surgeon. Problem List Items Addressed This Visit     Essential hypertension, benign - Primary   Relevant Orders   CMP14+EGFR   OSA on CPAP   Mixed hyperlipidemia   Relevant Orders   Lipid Panel w/o Chol/HDL Ratio   Other Visit Diagnoses       Elevated glucose level       Relevant Orders   Hemoglobin A1c       Return in about 4 months (around 05/08/2024).   Total time spent: 30 minutes  FERNAND FREDY RAMAN, MD  01/06/2024   This document may have been prepared by [facility] Voice Recognition software and as such may include unintentional dictation errors.

## 2024-01-07 ENCOUNTER — Ambulatory Visit: Payer: Self-pay | Admitting: Internal Medicine

## 2024-01-07 LAB — CMP14+EGFR
ALT: 7 IU/L (ref 0–44)
AST: 11 IU/L (ref 0–40)
Albumin: 3.8 g/dL — ABNORMAL LOW (ref 3.9–4.9)
Alkaline Phosphatase: 54 IU/L (ref 44–121)
BUN/Creatinine Ratio: 7 — ABNORMAL LOW (ref 10–24)
BUN: 7 mg/dL — ABNORMAL LOW (ref 8–27)
Bilirubin Total: 0.8 mg/dL (ref 0.0–1.2)
CO2: 22 mmol/L (ref 20–29)
Calcium: 9 mg/dL (ref 8.6–10.2)
Chloride: 104 mmol/L (ref 96–106)
Creatinine, Ser: 1.07 mg/dL (ref 0.76–1.27)
Globulin, Total: 1.8 g/dL (ref 1.5–4.5)
Glucose: 86 mg/dL (ref 70–99)
Potassium: 4.3 mmol/L (ref 3.5–5.2)
Sodium: 141 mmol/L (ref 134–144)
Total Protein: 5.6 g/dL — ABNORMAL LOW (ref 6.0–8.5)
eGFR: 76 mL/min/1.73 (ref >59)

## 2024-01-07 LAB — HEMOGLOBIN A1C
Est. average glucose Bld gHb Est-mCnc: 131 mg/dL
Hgb A1c MFr Bld: 6.2 % — ABNORMAL HIGH (ref 4.8–5.6)

## 2024-01-07 LAB — LIPID PANEL W/O CHOL/HDL RATIO
Cholesterol, Total: 185 mg/dL (ref 100–199)
HDL: 55 mg/dL (ref >39)
LDL Chol Calc (NIH): 105 mg/dL — ABNORMAL HIGH (ref 0–99)
Triglycerides: 141 mg/dL (ref 0–149)
VLDL Cholesterol Cal: 25 mg/dL (ref 5–40)

## 2024-01-07 NOTE — Progress Notes (Signed)
 Patient notified

## 2024-01-10 DIAGNOSIS — M25561 Pain in right knee: Secondary | ICD-10-CM | POA: Diagnosis not present

## 2024-01-10 DIAGNOSIS — Z8781 Personal history of (healed) traumatic fracture: Secondary | ICD-10-CM | POA: Diagnosis not present

## 2024-01-10 DIAGNOSIS — Z9889 Other specified postprocedural states: Secondary | ICD-10-CM | POA: Diagnosis not present

## 2024-01-15 DIAGNOSIS — Z8781 Personal history of (healed) traumatic fracture: Secondary | ICD-10-CM | POA: Diagnosis not present

## 2024-01-15 DIAGNOSIS — S82031D Displaced transverse fracture of right patella, subsequent encounter for closed fracture with routine healing: Secondary | ICD-10-CM | POA: Diagnosis not present

## 2024-01-15 DIAGNOSIS — Z9889 Other specified postprocedural states: Secondary | ICD-10-CM | POA: Diagnosis not present

## 2024-01-15 DIAGNOSIS — M25561 Pain in right knee: Secondary | ICD-10-CM | POA: Diagnosis not present

## 2024-01-31 DIAGNOSIS — S82031D Displaced transverse fracture of right patella, subsequent encounter for closed fracture with routine healing: Secondary | ICD-10-CM | POA: Diagnosis not present

## 2024-01-31 DIAGNOSIS — M25561 Pain in right knee: Secondary | ICD-10-CM | POA: Diagnosis not present

## 2024-01-31 DIAGNOSIS — Z8781 Personal history of (healed) traumatic fracture: Secondary | ICD-10-CM | POA: Diagnosis not present

## 2024-01-31 DIAGNOSIS — Z9889 Other specified postprocedural states: Secondary | ICD-10-CM | POA: Diagnosis not present

## 2024-02-04 DIAGNOSIS — Z8781 Personal history of (healed) traumatic fracture: Secondary | ICD-10-CM | POA: Diagnosis not present

## 2024-02-04 DIAGNOSIS — Z9889 Other specified postprocedural states: Secondary | ICD-10-CM | POA: Diagnosis not present

## 2024-02-04 DIAGNOSIS — M25561 Pain in right knee: Secondary | ICD-10-CM | POA: Diagnosis not present

## 2024-02-13 ENCOUNTER — Other Ambulatory Visit: Payer: Self-pay | Admitting: Internal Medicine

## 2024-02-13 DIAGNOSIS — E782 Mixed hyperlipidemia: Secondary | ICD-10-CM

## 2024-02-13 IMAGING — CR DG WRIST COMPLETE 3+V*L*
4 series · 4 of 4 positions shown · non-contrast
Comparison: 06/16/2012

CLINICAL DATA: pain

EXAM:
LEFT WRIST - COMPLETE 3+ VIEW

[wrist pa]
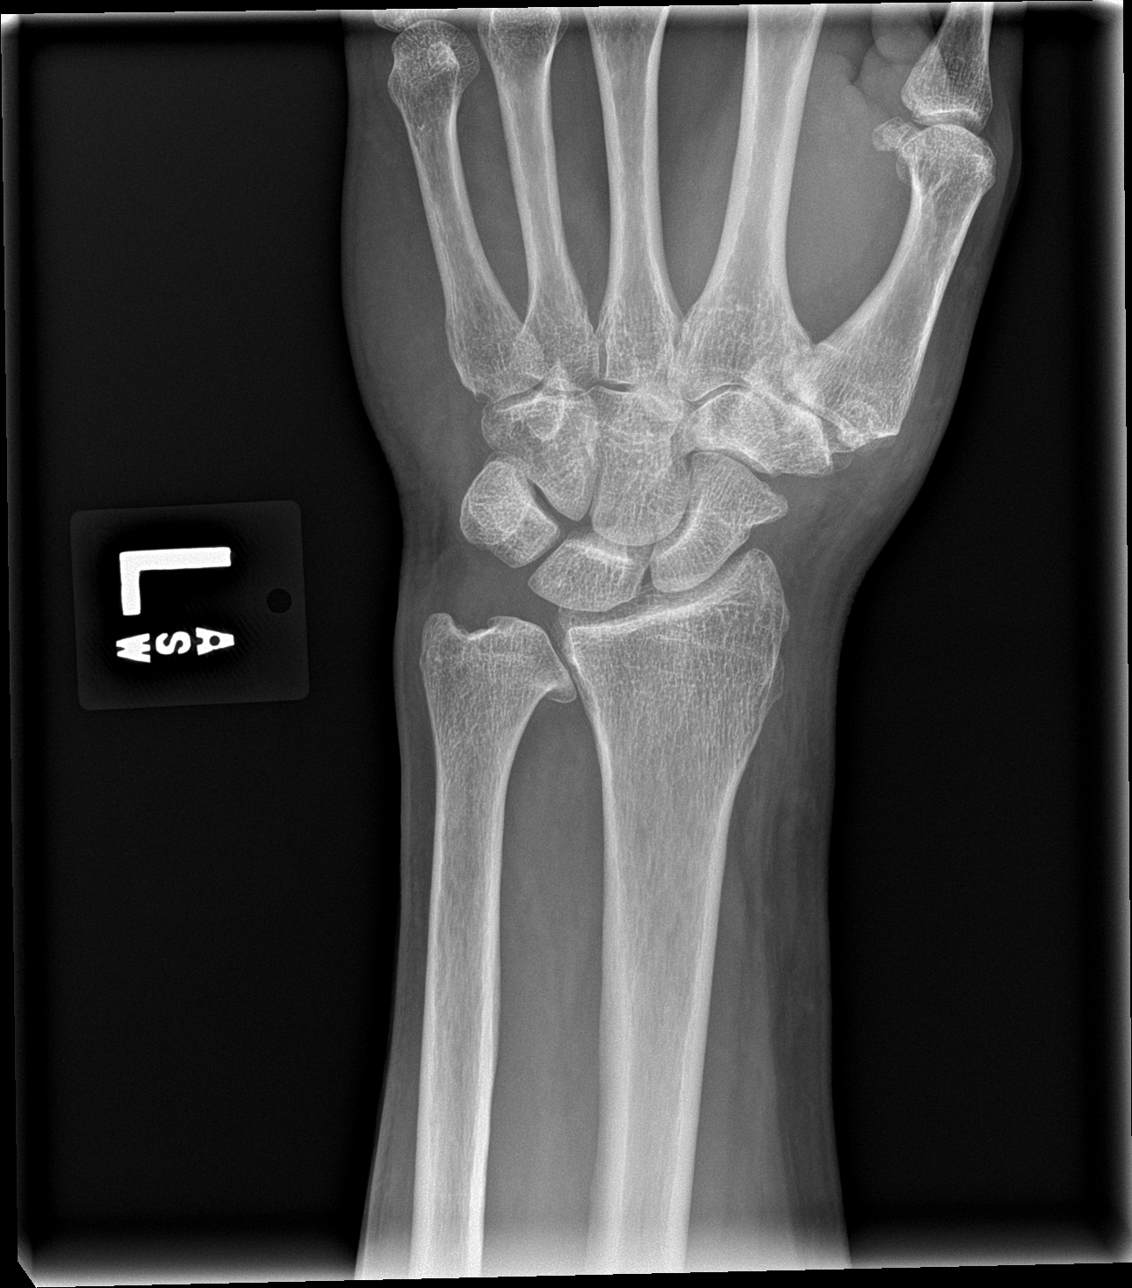

[wrist obl]
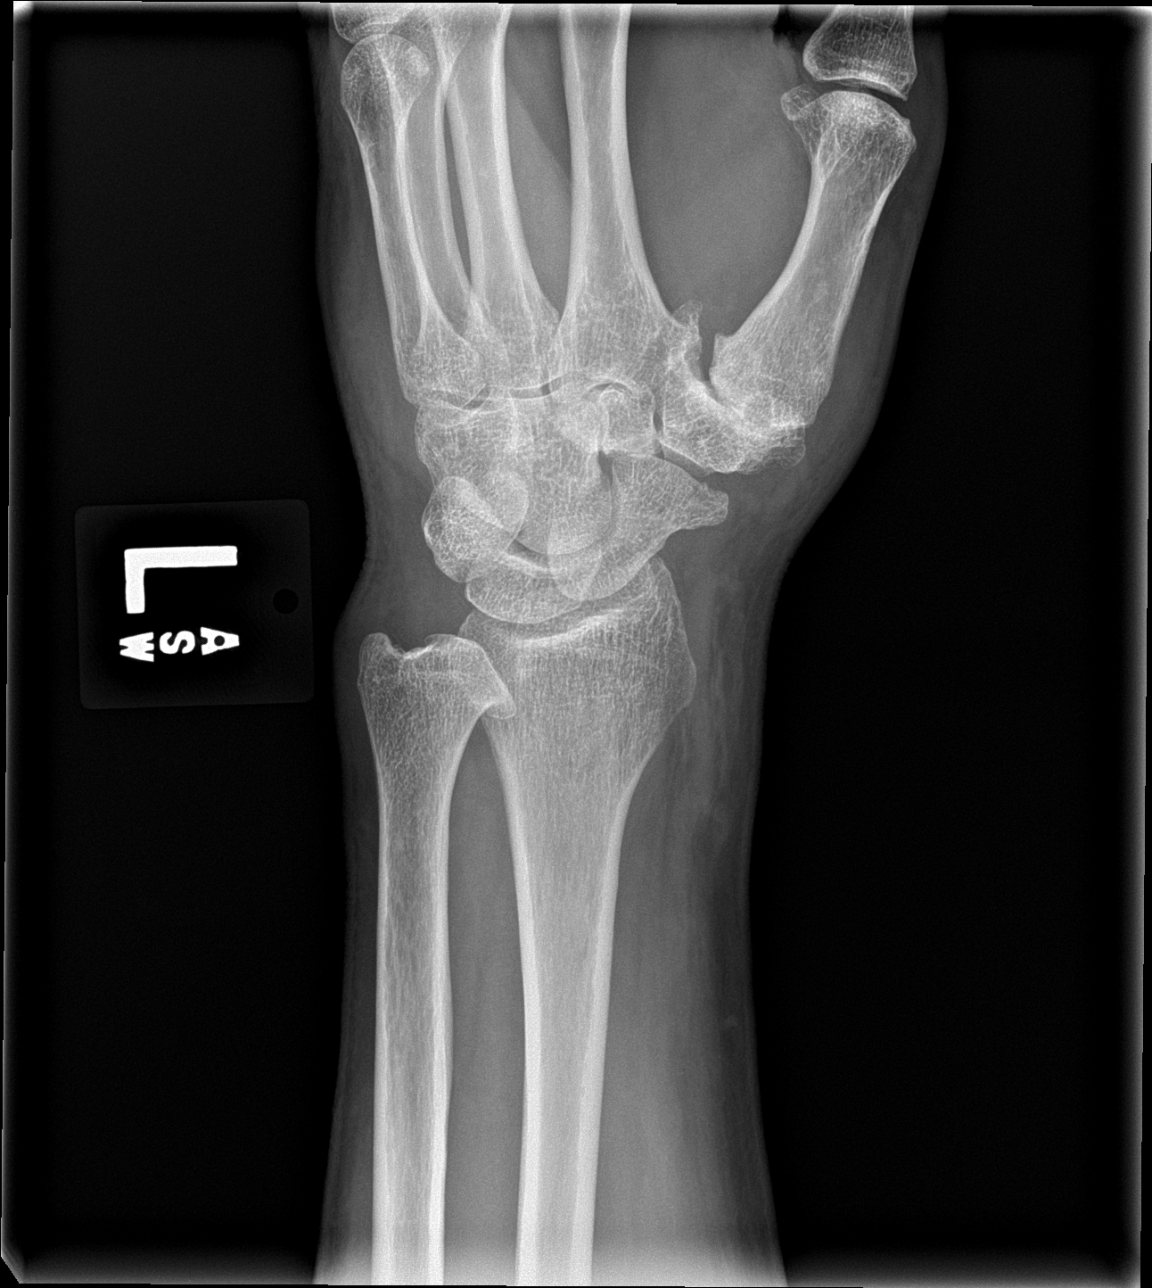

[wrist lat]
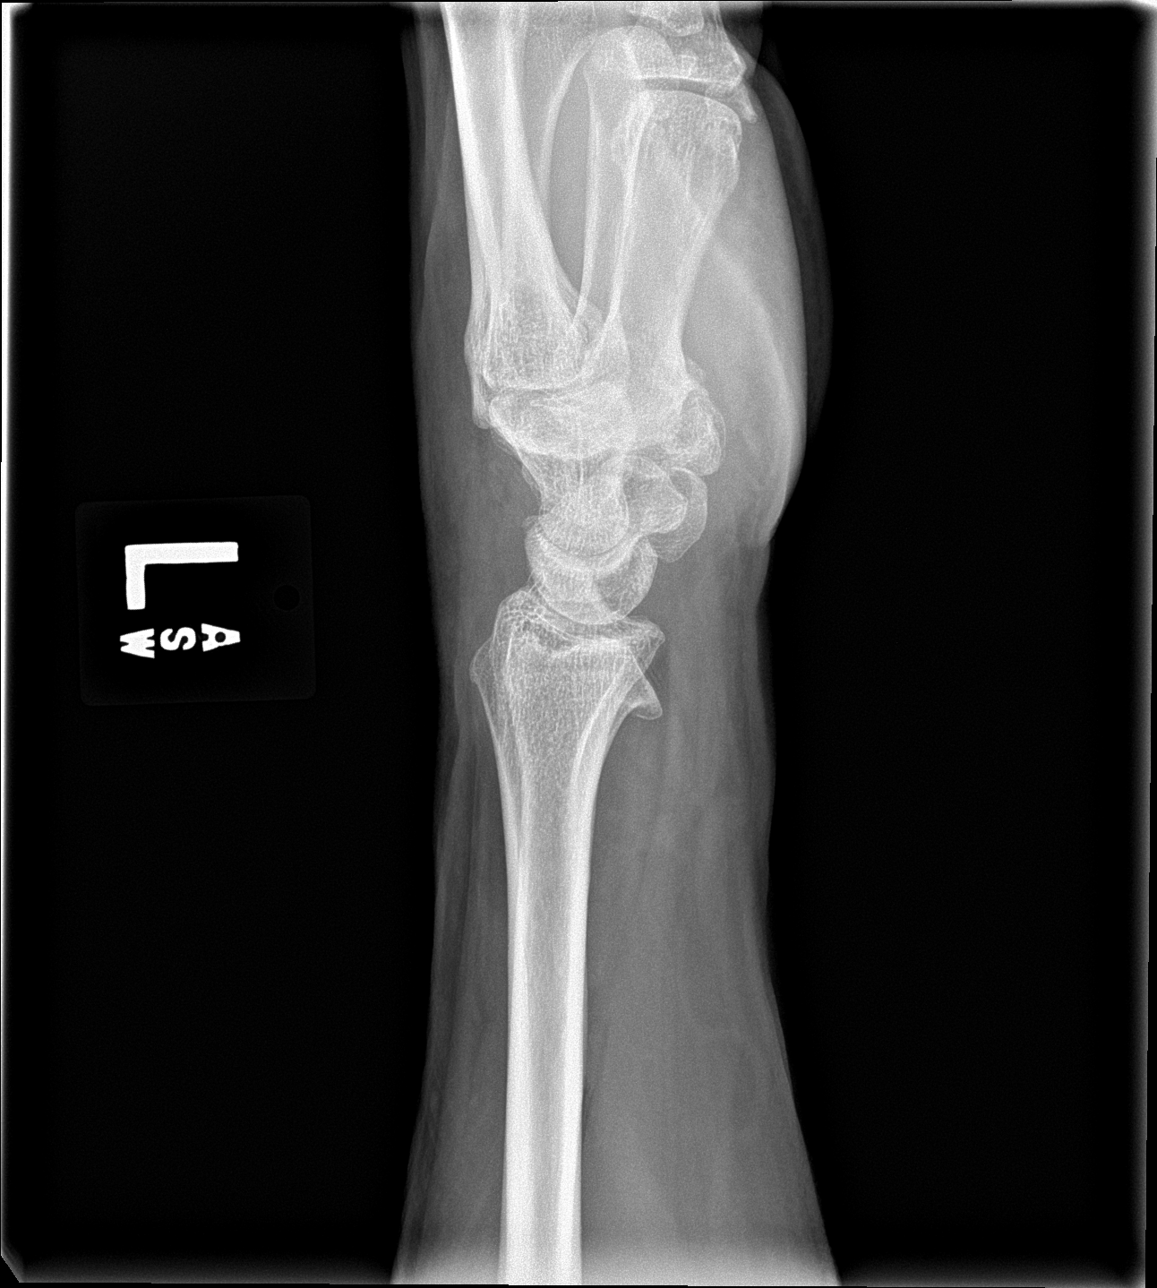

[wrist navicular]
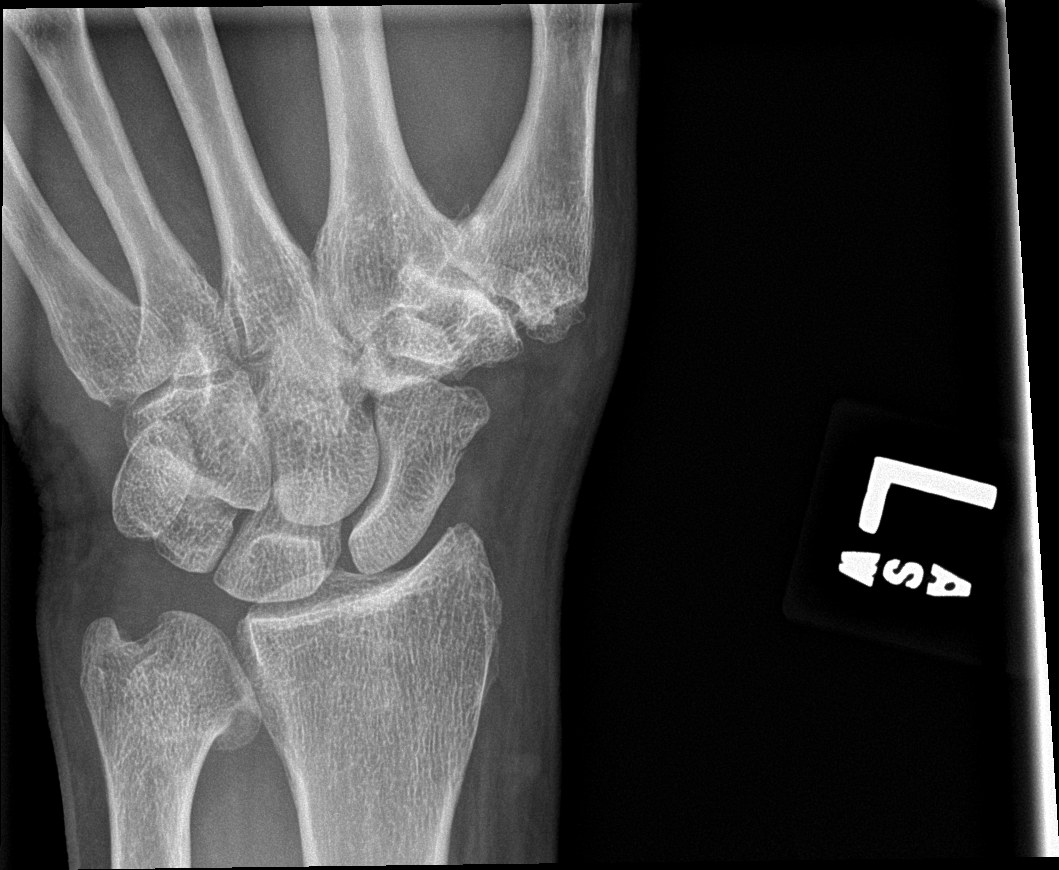

[4 of 4 positions shown; findings below may reference images not displayed]

FINDINGS: No fracture or dislocation. Progressive DJD at the first CMC
articulation and to a lesser degree at the STT joint. Mild
degenerative ulnar spurring at the DRUJ. Normal alignment and
mineralization. Soft tissues unremarkable.
IMPRESSION: 1. No acute findings.
2. Worsening first CMC and STT degenerative changes.

## 2024-02-18 DIAGNOSIS — Z8781 Personal history of (healed) traumatic fracture: Secondary | ICD-10-CM | POA: Diagnosis not present

## 2024-02-18 DIAGNOSIS — Z9889 Other specified postprocedural states: Secondary | ICD-10-CM | POA: Diagnosis not present

## 2024-02-18 DIAGNOSIS — M25561 Pain in right knee: Secondary | ICD-10-CM | POA: Diagnosis not present

## 2024-02-18 DIAGNOSIS — S82031D Displaced transverse fracture of right patella, subsequent encounter for closed fracture with routine healing: Secondary | ICD-10-CM | POA: Diagnosis not present

## 2024-02-25 DIAGNOSIS — Z9889 Other specified postprocedural states: Secondary | ICD-10-CM | POA: Diagnosis not present

## 2024-02-25 DIAGNOSIS — M25561 Pain in right knee: Secondary | ICD-10-CM | POA: Diagnosis not present

## 2024-02-25 DIAGNOSIS — Z8781 Personal history of (healed) traumatic fracture: Secondary | ICD-10-CM | POA: Diagnosis not present

## 2024-03-03 DIAGNOSIS — M25561 Pain in right knee: Secondary | ICD-10-CM | POA: Diagnosis not present

## 2024-03-03 DIAGNOSIS — Z9889 Other specified postprocedural states: Secondary | ICD-10-CM | POA: Diagnosis not present

## 2024-03-03 DIAGNOSIS — Z8781 Personal history of (healed) traumatic fracture: Secondary | ICD-10-CM | POA: Diagnosis not present

## 2024-03-10 DIAGNOSIS — Z9889 Other specified postprocedural states: Secondary | ICD-10-CM | POA: Diagnosis not present

## 2024-03-10 DIAGNOSIS — Z8781 Personal history of (healed) traumatic fracture: Secondary | ICD-10-CM | POA: Diagnosis not present

## 2024-03-10 DIAGNOSIS — M25561 Pain in right knee: Secondary | ICD-10-CM | POA: Diagnosis not present

## 2024-03-17 DIAGNOSIS — M25561 Pain in right knee: Secondary | ICD-10-CM | POA: Diagnosis not present

## 2024-03-17 DIAGNOSIS — Z9889 Other specified postprocedural states: Secondary | ICD-10-CM | POA: Diagnosis not present

## 2024-03-17 DIAGNOSIS — Z8781 Personal history of (healed) traumatic fracture: Secondary | ICD-10-CM | POA: Diagnosis not present

## 2024-03-23 DIAGNOSIS — Z9889 Other specified postprocedural states: Secondary | ICD-10-CM | POA: Diagnosis not present

## 2024-03-23 DIAGNOSIS — Z8781 Personal history of (healed) traumatic fracture: Secondary | ICD-10-CM | POA: Diagnosis not present

## 2024-03-23 DIAGNOSIS — M25561 Pain in right knee: Secondary | ICD-10-CM | POA: Diagnosis not present

## 2024-03-31 DIAGNOSIS — Z8781 Personal history of (healed) traumatic fracture: Secondary | ICD-10-CM | POA: Diagnosis not present

## 2024-03-31 DIAGNOSIS — Z9889 Other specified postprocedural states: Secondary | ICD-10-CM | POA: Diagnosis not present

## 2024-03-31 DIAGNOSIS — M25561 Pain in right knee: Secondary | ICD-10-CM | POA: Diagnosis not present

## 2024-03-31 DIAGNOSIS — S82031D Displaced transverse fracture of right patella, subsequent encounter for closed fracture with routine healing: Secondary | ICD-10-CM | POA: Diagnosis not present

## 2024-04-10 DIAGNOSIS — M25561 Pain in right knee: Secondary | ICD-10-CM | POA: Diagnosis not present

## 2024-04-10 DIAGNOSIS — Z8781 Personal history of (healed) traumatic fracture: Secondary | ICD-10-CM | POA: Diagnosis not present

## 2024-04-10 DIAGNOSIS — Z9889 Other specified postprocedural states: Secondary | ICD-10-CM | POA: Diagnosis not present

## 2024-04-16 DIAGNOSIS — Z8781 Personal history of (healed) traumatic fracture: Secondary | ICD-10-CM | POA: Diagnosis not present

## 2024-04-16 DIAGNOSIS — M25561 Pain in right knee: Secondary | ICD-10-CM | POA: Diagnosis not present

## 2024-04-16 DIAGNOSIS — Z9889 Other specified postprocedural states: Secondary | ICD-10-CM | POA: Diagnosis not present

## 2024-05-08 ENCOUNTER — Encounter: Payer: Self-pay | Admitting: Internal Medicine

## 2024-05-08 ENCOUNTER — Ambulatory Visit (INDEPENDENT_AMBULATORY_CARE_PROVIDER_SITE_OTHER): Admitting: Internal Medicine

## 2024-05-08 VITALS — BP 150/80 | HR 66 | Ht 72.0 in | Wt 214.8 lb

## 2024-05-08 DIAGNOSIS — Z6829 Body mass index (BMI) 29.0-29.9, adult: Secondary | ICD-10-CM | POA: Insufficient documentation

## 2024-05-08 DIAGNOSIS — N400 Enlarged prostate without lower urinary tract symptoms: Secondary | ICD-10-CM

## 2024-05-08 DIAGNOSIS — I10 Essential (primary) hypertension: Secondary | ICD-10-CM

## 2024-05-08 DIAGNOSIS — E663 Overweight: Secondary | ICD-10-CM

## 2024-05-08 DIAGNOSIS — G4733 Obstructive sleep apnea (adult) (pediatric): Secondary | ICD-10-CM

## 2024-05-08 DIAGNOSIS — R7309 Other abnormal glucose: Secondary | ICD-10-CM | POA: Insufficient documentation

## 2024-05-08 DIAGNOSIS — Z125 Encounter for screening for malignant neoplasm of prostate: Secondary | ICD-10-CM | POA: Insufficient documentation

## 2024-05-08 DIAGNOSIS — E782 Mixed hyperlipidemia: Secondary | ICD-10-CM

## 2024-05-08 MED ORDER — AMLODIPINE BESY-BENAZEPRIL HCL 5-10 MG PO CAPS: 1.0000 | ORAL_CAPSULE | Freq: Every day | ORAL | 11 refills | Status: DC

## 2024-05-08 NOTE — Progress Notes (Signed)
 Established Patient Office Visit  Subjective:  Patient ID: Spencer Smith, male    DOB: 03-26-1956  Age: 68 y.o. MRN: [id number]  Chief Complaint  Patient presents with   Follow-up    4 month follow up    Patient is here today for follow up. He reports he is doing well but endorses his right knee s/p Patella fracture and repair, is still causing him mobility issues. He is still doing physical therapy. His BP is elevated today and he has taken his medications today. Will switch medication to combination with benazepril once daily.  His last colonoscopy was in 2022. He is due for labs today and will check PSA.  Patient declines flu shot today.    No other concerns at this time.   Past Medical History:  Diagnosis Date   Anxiety    Arthritis    Benign prostatic hyperplasia without lower urinary tract symptoms 01/04/2023   Essential hypertension, benign    History of kidney stones    Hypercholesterolemia    Hypertension    Lesion of bladder 2025   OSA on CPAP 08/07/2022   Sleep apnea     Past Surgical History:  Procedure Laterality Date   COLONOSCOPY  2011   CYSTOSCOPY WITH BIOPSY N/A 09/09/2023   Procedure: CYSTOSCOPY, WITH BIOPSY;  Surgeon: Penne Knee, MD;  Location: [facility];  Service: Urology;  Laterality: N/A;   ORIF PATELLA Right 12/12/2023   Procedure: OPEN REDUCTION INTERNAL FIXATION (ORIF) PATELLA;  Surgeon: Lorelle Hussar, MD;  Location: [facility];  Service: Orthopedics;  Laterality: Right;   PROSTATE BIOPSY N/A 05/26/2020   Procedure: PROSTATE BIOPSY GRAYCE;  Surgeon: Kassie Ozell SAUNDERS, MD;  Location: [facility];  Service: Urology;  Laterality: N/A;   TONSILLECTOMY  1963    Social History   Socioeconomic History   Marital status: Married    Spouse name: Delon   Number of children: Not on file   Years of education: Not on file   Highest education level: Not on file  Occupational History   Occupation: retired  Tobacco Use   Smoking status: Former     Current packs/day: 0.00    Types: Cigarettes    Quit date: 2019    Years since quitting: 6.8   Smokeless tobacco: Never  Vaping Use   Vaping status: Never Used  Substance and Sexual Activity   Alcohol use: Not on file    Comment: none since 2019   Drug use: Not Currently    Types: Marijuana    Comment: several years ago   Sexual activity: Not on file  Other Topics Concern   Not on file  Social History Narrative   Not on file   Social Drivers of Health   Financial Resource Strain: Low Risk  (03/31/2024)   Received from [facility]   Overall Financial Resource Strain (CARDIA)    Difficulty of Paying Living Expenses: Not hard at all  Food Insecurity: No Food Insecurity (03/31/2024)   Received from [facility]   Hunger Vital Sign    Within the past 12 months, you worried that your food would run out before you got the money to buy more.: Never true    Within the past 12 months, the food you bought just didn't last and you didn't have money to get more.: Never true  Transportation Needs: No Transportation Needs (03/31/2024)   Received from [facility] - Transportation  In the past 12 months, has lack of transportation kept you from medical appointments or from getting medications?: No    Lack of Transportation (Non-Medical): No  Physical Activity: Not on file  Stress: No Stress Concern Present (07/08/2023)   [employer] of Occupational Health - Occupational Stress Questionnaire    Feeling of Stress : Not at all  Social Connections: Not on file  Intimate Partner Violence: Not At Risk (07/08/2023)   Humiliation, Afraid, Rape, and Kick questionnaire    Fear of Current or Ex-Partner: No    Emotionally Abused: No    Physically Abused: No    Sexually Abused: No    Family History  Problem Relation Age of Onset   Heart disease Mother    Dementia Father     Allergies  Allergen Reactions   Alpha-Gal Hives,  Itching and Rash    Outpatient Medications Prior to Visit  Medication Sig   acetaminophen  (TYLENOL ) 500 MG tablet Take 2 tablets (1,000 mg total) by mouth every 6 (six) hours as needed.   atorvastatin  (LIPITOR) 20 MG tablet TAKE 1 TABLET BY MOUTH DAILY   Calcium  Carbonate (CALCIUM  600 PO) Take 600 mg by mouth daily.   cholecalciferol (VITAMIN D3) 25 MCG (1000 UNIT) tablet Take 1,000 Units by mouth daily.   FLUoxetine  (PROZAC ) 40 MG capsule TAKE 1 CAPSULE BY MOUTH DAILY   Multiple Vitamin (MULTIVITAMIN WITH MINERALS) TABS tablet Take 1 tablet by mouth daily.   NON FORMULARY Pt uses a cpap nightly   ondansetron  (ZOFRAN -ODT) 4 MG disintegrating tablet Take 1 tablet (4 mg total) by mouth every 8 (eight) hours as needed for nausea or vomiting.   traMADol  (ULTRAM ) 50 MG tablet Take 1 tablet (50 mg total) by mouth every 6 (six) hours as needed.   [DISCONTINUED] amLODipine  (NORVASC ) 5 MG tablet TAKE 1 TABLET BY MOUTH DAILY   [DISCONTINUED] oxyCODONE  (ROXICODONE ) 5 MG immediate release tablet Take 1 tablet (5 mg total) by mouth every 8 (eight) hours as needed for breakthrough pain. (Patient not taking: Reported on 05/08/2024)   [DISCONTINUED] penicillin v potassium (VEETID) 500 MG tablet Take 500 mg by mouth 4 (four) times daily. (Patient not taking: Reported on 05/08/2024)   No facility-administered medications prior to visit.    Review of Systems  Constitutional: Negative.  Negative for chills, fever and malaise/fatigue.  HENT: Negative.  Negative for congestion and sore throat.   Eyes: Negative.  Negative for blurred vision and pain.  Respiratory: Negative.  Negative for cough and shortness of breath.   Cardiovascular: Negative.  Negative for chest pain, palpitations and leg swelling.  Gastrointestinal: Negative.  Negative for abdominal pain, blood in stool, constipation, diarrhea, heartburn, melena, nausea and vomiting.  Genitourinary: Negative.  Negative for dysuria, flank pain, frequency and  urgency.  Musculoskeletal:  Positive for joint pain (chronic Right knee pain). Negative for myalgias.  Skin: Negative.   Neurological: Negative.  Negative for dizziness, tingling, sensory change, weakness and headaches.  Endo/Heme/Allergies: Negative.   Psychiatric/Behavioral: Negative.  Negative for depression and suicidal ideas. The patient is not nervous/anxious.        Objective:   BP (!) 150/80   Pulse 66   Ht 6' (1.829 m)   Wt 214 lb 12.8 oz (97.4 kg)   SpO2 99%   BMI 29.13 kg/m   Vitals:   05/08/24 0910  BP: (!) 150/80  Pulse: 66  Height: 6' (1.829 m)  Weight: 214 lb 12.8 oz (97.4 kg)  SpO2: 99%  BMI (  Calculated): 29.13    Physical Exam Vitals and nursing note reviewed.  Constitutional:      General: He is not in acute distress.    Appearance: Normal appearance. He is not ill-appearing.  HENT:     Head: Normocephalic and atraumatic.     Nose: Nose normal.     Mouth/Throat:     Mouth: Mucous membranes are moist.     Pharynx: Oropharynx is clear.  Eyes:     Conjunctiva/sclera: Conjunctivae normal.     Pupils: Pupils are equal, round, and reactive to light.  Cardiovascular:     Rate and Rhythm: Normal rate and regular rhythm.     Pulses: Normal pulses.     Heart sounds: Normal heart sounds.  Pulmonary:     Effort: Pulmonary effort is normal.     Breath sounds: Normal breath sounds. No wheezing or rhonchi.  Abdominal:     General: Bowel sounds are normal. There is no distension.     Palpations: Abdomen is soft.     Tenderness: There is no abdominal tenderness.  Musculoskeletal:        General: Normal range of motion.     Cervical back: Normal range of motion and neck supple.     Right lower leg: No edema.     Left lower leg: No edema.  Skin:    General: Skin is warm and dry.     Capillary Refill: Capillary refill takes less than 2 seconds.  Neurological:     General: No focal deficit present.     Mental Status: He is alert and oriented to person,  place, and time.     Sensory: No sensory deficit.     Motor: No weakness.  Psychiatric:        Mood and Affect: Mood normal.        Behavior: Behavior normal.        Judgment: Judgment normal.      No results found for any visits on 05/08/24.  No results found for this or any previous visit (from the past 2160 hours).    Assessment & Plan:  Start Lotrel 5-10 mg daily. Continue other medications as prescribed. Check routine blood work today and FU with patient on results. Problem List Items Addressed This Visit     Essential hypertension, benign - Primary   Relevant Medications   amLODipine -benazepril (LOTREL) 5-10 MG capsule   Other Relevant Orders   CMP14+EGFR   CBC with Diff   OSA on CPAP   Mixed hyperlipidemia   Relevant Medications   amLODipine -benazepril (LOTREL) 5-10 MG capsule   Other Relevant Orders   Lipid Panel w/o Chol/HDL Ratio   CBC with Diff   Benign prostatic hyperplasia without lower urinary tract symptoms   Elevated glucose level   Relevant Orders   Hemoglobin A1c   Overweight with body mass index (BMI) of 29 to 29.9 in adult   Prostate cancer screening   Relevant Orders   PSA    Return in about 2 weeks (around 05/22/2024).   Total time spent: 25 minutes. This time includes review of previous notes and results and patient face to face interaction during today's visit.    FERNAND FREDY RAMAN, MD  05/08/2024   This document may have been prepared by [facility] Voice Recognition software and as such may include unintentional dictation errors.

## 2024-05-09 LAB — CBC WITH DIFFERENTIAL/PLATELET
Basophils Absolute: 0.1 x10E3/uL (ref 0.0–0.2)
Basos: 1 %
EOS (ABSOLUTE): 0.3 x10E3/uL (ref 0.0–0.4)
Eos: 5 %
Hematocrit: 35.9 % — ABNORMAL LOW (ref 37.5–51.0)
Hemoglobin: 12.1 g/dL — ABNORMAL LOW (ref 13.0–17.7)
Immature Grans (Abs): 0 x10E3/uL (ref 0.0–0.1)
Immature Granulocytes: 0 %
Lymphocytes Absolute: 1.5 x10E3/uL (ref 0.7–3.1)
Lymphs: 21 %
MCH: 31.4 pg (ref 26.6–33.0)
MCHC: 33.7 g/dL (ref 31.5–35.7)
MCV: 93 fL (ref 79–97)
Monocytes Absolute: 0.7 x10E3/uL (ref 0.1–0.9)
Monocytes: 10 %
Neutrophils Absolute: 4.4 x10E3/uL (ref 1.4–7.0)
Neutrophils: 63 %
Platelets: 256 x10E3/uL (ref 150–450)
RBC: 3.85 x10E6/uL — ABNORMAL LOW (ref 4.14–5.80)
RDW: 13.7 % (ref 11.6–15.4)
WBC: 7.1 x10E3/uL (ref 3.4–10.8)

## 2024-05-09 LAB — CMP14+EGFR
ALT: 13 IU/L (ref 0–44)
AST: 15 IU/L (ref 0–40)
Albumin: 4.4 g/dL (ref 3.9–4.9)
Alkaline Phosphatase: 63 IU/L (ref 47–123)
BUN/Creatinine Ratio: 13 (ref 10–24)
BUN: 14 mg/dL (ref 8–27)
Bilirubin Total: 0.4 mg/dL (ref 0.0–1.2)
CO2: 19 mmol/L — ABNORMAL LOW (ref 20–29)
Calcium: 9.4 mg/dL (ref 8.6–10.2)
Chloride: 103 mmol/L (ref 96–106)
Creatinine, Ser: 1.12 mg/dL (ref 0.76–1.27)
Globulin, Total: 2.3 g/dL (ref 1.5–4.5)
Glucose: 72 mg/dL (ref 70–99)
Potassium: 4.8 mmol/L (ref 3.5–5.2)
Sodium: 143 mmol/L (ref 134–144)
Total Protein: 6.7 g/dL (ref 6.0–8.5)
eGFR: 71 mL/min/1.73 (ref >59)

## 2024-05-09 LAB — LIPID PANEL W/O CHOL/HDL RATIO
Cholesterol, Total: 125 mg/dL (ref 100–199)
HDL: 49 mg/dL (ref >39)
LDL Chol Calc (NIH): 66 mg/dL (ref 0–99)
Triglycerides: 41 mg/dL (ref 0–149)
VLDL Cholesterol Cal: 10 mg/dL (ref 5–40)

## 2024-05-09 LAB — HEMOGLOBIN A1C
Est. average glucose Bld gHb Est-mCnc: 146 mg/dL
Hgb A1c MFr Bld: 6.7 % — ABNORMAL HIGH (ref 4.8–5.6)

## 2024-05-09 LAB — PSA: Prostate Specific Ag, Serum: <0.1 ng/mL (ref 0.0–4.0)

## 2024-05-11 ENCOUNTER — Ambulatory Visit: Payer: Self-pay | Admitting: Internal Medicine

## 2024-05-11 DIAGNOSIS — E119 Type 2 diabetes mellitus without complications: Secondary | ICD-10-CM

## 2024-05-11 MED ORDER — METFORMIN HCL ER 500 MG PO TB24: 500.0000 mg | ORAL_TABLET | Freq: Every day | ORAL | 3 refills | Status: AC

## 2024-05-14 NOTE — Progress Notes (Signed)
 Patient notified

## 2024-05-28 ENCOUNTER — Ambulatory Visit (INDEPENDENT_AMBULATORY_CARE_PROVIDER_SITE_OTHER): Admitting: Internal Medicine

## 2024-05-28 ENCOUNTER — Encounter: Payer: Self-pay | Admitting: Internal Medicine

## 2024-05-28 VITALS — BP 122/78 | HR 72 | Ht 72.0 in | Wt 211.6 lb

## 2024-05-28 DIAGNOSIS — E1165 Type 2 diabetes mellitus with hyperglycemia: Secondary | ICD-10-CM

## 2024-05-28 DIAGNOSIS — E1169 Type 2 diabetes mellitus with other specified complication: Secondary | ICD-10-CM

## 2024-05-28 DIAGNOSIS — G4733 Obstructive sleep apnea (adult) (pediatric): Secondary | ICD-10-CM

## 2024-05-28 DIAGNOSIS — E782 Mixed hyperlipidemia: Secondary | ICD-10-CM | POA: Diagnosis not present

## 2024-05-28 DIAGNOSIS — E1159 Type 2 diabetes mellitus with other circulatory complications: Secondary | ICD-10-CM

## 2024-05-28 DIAGNOSIS — I152 Hypertension secondary to endocrine disorders: Secondary | ICD-10-CM

## 2024-05-28 DIAGNOSIS — Z6828 Body mass index (BMI) 28.0-28.9, adult: Secondary | ICD-10-CM | POA: Insufficient documentation

## 2024-05-28 DIAGNOSIS — E119 Type 2 diabetes mellitus without complications: Secondary | ICD-10-CM | POA: Insufficient documentation

## 2024-05-28 DIAGNOSIS — E663 Overweight: Secondary | ICD-10-CM

## 2024-05-28 LAB — POC CREATINE & ALBUMIN,URINE
Albumin/Creatinine Ratio, Urine, POC: <30
Creatinine, POC: 10 mg/dL
Microalbumin Ur, POC: 10 mg/L

## 2024-05-28 LAB — POCT CBG (FASTING - GLUCOSE)-MANUAL ENTRY: Glucose Fasting, POC: 104 mg/dL — AB (ref 70–99)

## 2024-05-28 MED ORDER — AMLODIPINE BESY-BENAZEPRIL HCL 5-10 MG PO CAPS: 1.0000 | ORAL_CAPSULE | Freq: Every day | ORAL | 3 refills | Status: AC

## 2024-05-28 NOTE — Progress Notes (Signed)
 Established Patient Office Visit  Subjective:  Patient ID: Spencer Smith, male    DOB: February 15, 1956  Age: 68 y.o. MRN: [id number]  Chief Complaint  Patient presents with   Follow-up    2 week follow up    Patient is here today for follow up. He states he is feeling well today.  His BP has improved with his recent medication change. He denies any chest pain, shortness of breath, palpitations. He reports he has started taking the metformin  once daily and it is not causing any abdominal distress.  Patient states he has had OSA and used CPAP for over 20 years. He previously had CPAP through medical supply store that broke approximately 12 years ago and they recommend him get replacement machine online which patient did. He reports frequent night time waking due to increased CPAP pressure and feels as if the machine is not functioning properly anymore. He denies headaches or feeling unrested in the morning. Will reach out to medical supply store for machine replacement. Discussed potential need for updated sleep study prior to getting new machine.     No other concerns at this time.   Past Medical History:  Diagnosis Date   Anxiety    Arthritis    Benign prostatic hyperplasia without lower urinary tract symptoms 01/04/2023   Essential hypertension, benign    History of kidney stones    Hypercholesterolemia    Hypertension    Lesion of bladder 2025   OSA on CPAP 08/07/2022   Sleep apnea     Past Surgical History:  Procedure Laterality Date   COLONOSCOPY  2011   CYSTOSCOPY WITH BIOPSY N/A 09/09/2023   Procedure: CYSTOSCOPY, WITH BIOPSY;  Surgeon: Penne Knee, MD;  Location: [facility];  Service: Urology;  Laterality: N/A;   ORIF PATELLA Right 12/12/2023   Procedure: OPEN REDUCTION INTERNAL FIXATION (ORIF) PATELLA;  Surgeon: Lorelle Hussar, MD;  Location: [facility];  Service: Orthopedics;  Laterality: Right;   PROSTATE BIOPSY N/A 05/26/2020   Procedure: PROSTATE BIOPSY GRAYCE;   Surgeon: Kassie Ozell SAUNDERS, MD;  Location: [facility];  Service: Urology;  Laterality: N/A;   TONSILLECTOMY  1963    Social History   Socioeconomic History   Marital status: Married    Spouse name: Delon   Number of children: Not on file   Years of education: Not on file   Highest education level: Not on file  Occupational History   Occupation: retired  Tobacco Use   Smoking status: Former    Current packs/day: 0.00    Types: Cigarettes    Quit date: 2019    Years since quitting: 6.9   Smokeless tobacco: Never  Vaping Use   Vaping status: Never Used  Substance and Sexual Activity   Alcohol use: Not on file    Comment: none since 2019   Drug use: Not Currently    Types: Marijuana    Comment: several years ago   Sexual activity: Not on file  Other Topics Concern   Not on file  Social History Narrative   Not on file   Social Drivers of Health   Financial Resource Strain: Low Risk  (03/31/2024)   [facility]   Overall Financial Resource Strain (CARDIA)    Difficulty of Paying Living Expenses: Not hard at all  Food Insecurity: No Food Insecurity (03/31/2024)   Received from [facility]   Hunger Vital Sign    Within the past 12 months, you  worried that your food would run out before you got the money to buy more.: Never true    Within the past 12 months, the food you bought just didn't last and you didn't have money to get more.: Never true  Transportation Needs: No Transportation Needs (03/31/2024)   Received from [facility] - Transportation    In the past 12 months, has lack of transportation kept you from medical appointments or from getting medications?: No    Lack of Transportation (Non-Medical): No  Physical Activity: Not on file  Stress: No Stress Concern Present (07/08/2023)   [employer] - Occupational Stress Questionnaire    Feeling of Stress : Not at all   Social Connections: Not on file  Intimate Partner Violence: Not At Risk (07/08/2023)   Humiliation, Afraid, Rape, and Kick questionnaire    Fear of Current or Ex-Partner: No    Emotionally Abused: No    Physically Abused: No    Sexually Abused: No    Family History  Problem Relation Age of Onset   Heart disease Mother    Dementia Father     Allergies  Allergen Reactions   Alpha-Gal Hives, Itching and Rash    Outpatient Medications Prior to Visit  Medication Sig   acetaminophen  (TYLENOL ) 500 MG tablet Take 2 tablets (1,000 mg total) by mouth every 6 (six) hours as needed.   atorvastatin  (LIPITOR) 20 MG tablet TAKE 1 TABLET BY MOUTH DAILY   Calcium  Carbonate (CALCIUM  600 PO) Take 600 mg by mouth daily.   cholecalciferol (VITAMIN D3) 25 MCG (1000 UNIT) tablet Take 1,000 Units by mouth daily.   FLUoxetine  (PROZAC ) 40 MG capsule TAKE 1 CAPSULE BY MOUTH DAILY   metFORMIN  (GLUCOPHAGE -XR) 500 MG 24 hr tablet Take 1 tablet (500 mg total) by mouth daily with breakfast.   Multiple Vitamin (MULTIVITAMIN WITH MINERALS) TABS tablet Take 1 tablet by mouth daily.   NON FORMULARY Pt uses a cpap nightly   ondansetron  (ZOFRAN -ODT) 4 MG disintegrating tablet Take 1 tablet (4 mg total) by mouth every 8 (eight) hours as needed for nausea or vomiting.   traMADol  (ULTRAM ) 50 MG tablet Take 1 tablet (50 mg total) by mouth every 6 (six) hours as needed.   [DISCONTINUED] amLODipine -benazepril  (LOTREL) 5-10 MG capsule Take 1 capsule by mouth daily.   No facility-administered medications prior to visit.    Review of Systems  Constitutional: Negative.  Negative for chills, fever and malaise/fatigue.  HENT: Negative.  Negative for congestion and sore throat.   Eyes: Negative.  Negative for blurred vision and pain.  Respiratory: Negative.  Negative for cough and shortness of breath.   Cardiovascular: Negative.  Negative for chest pain, palpitations and leg swelling.  Gastrointestinal: Negative.  Negative  for abdominal pain, blood in stool, constipation, diarrhea, heartburn, melena, nausea and vomiting.  Genitourinary: Negative.  Negative for dysuria, flank pain, frequency and urgency.  Musculoskeletal: Negative.  Negative for joint pain and myalgias.  Skin: Negative.   Neurological: Negative.  Negative for dizziness, tingling, sensory change, weakness and headaches.  Endo/Heme/Allergies: Negative.   Psychiatric/Behavioral: Negative.  Negative for depression and suicidal ideas. The patient is not nervous/anxious.        Objective:   BP 122/78   Pulse 72   Ht 6' (1.829 m)   Wt 211 lb 9.6 oz (96 kg)   SpO2 96%   BMI 28.70 kg/m   Vitals:   05/28/24 0932  BP: 122/78  Pulse: 72  Height: 6' (1.829 m)  Weight: 211 lb 9.6 oz (96 kg)  SpO2: 96%  BMI (Calculated): 28.69    Physical Exam Vitals and nursing note reviewed.  Constitutional:      General: He is not in acute distress.    Appearance: Normal appearance. He is not ill-appearing.  HENT:     Head: Normocephalic and atraumatic.     Nose: Nose normal.     Mouth/Throat:     Mouth: Mucous membranes are moist.     Pharynx: Oropharynx is clear.  Eyes:     Conjunctiva/sclera: Conjunctivae normal.     Pupils: Pupils are equal, round, and reactive to light.  Cardiovascular:     Rate and Rhythm: Normal rate and regular rhythm.     Pulses: Normal pulses.     Heart sounds: Normal heart sounds.  Pulmonary:     Effort: Pulmonary effort is normal.     Breath sounds: Normal breath sounds. No wheezing or rhonchi.  Abdominal:     General: Bowel sounds are normal. There is no distension.     Palpations: Abdomen is soft.     Tenderness: There is no abdominal tenderness.  Musculoskeletal:        General: Normal range of motion.     Cervical back: Normal range of motion and neck supple.     Right lower leg: No edema.     Left lower leg: No edema.  Skin:    General: Skin is warm and dry.     Capillary Refill: Capillary refill takes  less than 2 seconds.  Neurological:     General: No focal deficit present.     Mental Status: He is alert and oriented to person, place, and time.     Sensory: No sensory deficit.     Motor: No weakness.  Psychiatric:        Mood and Affect: Mood normal.        Behavior: Behavior normal.        Judgment: Judgment normal.      Results for orders placed or performed in visit on 05/28/24  POCT CBG (Fasting - Glucose)  Result Value Ref Range   Glucose Fasting, POC 104 (A) 70 - 99 mg/dL  POC CREATINE & ALBUMIN,URINE  Result Value Ref Range   Microalbumin Ur, POC 10 mg/L   Creatinine, POC 10 mg/dL   Albumin/Creatinine Ratio, Urine, POC <30     Recent Results (from the past 2160 hours)  CMP14+EGFR     Status: Abnormal   Collection Time: 05/08/24 10:06 AM  Result Value Ref Range   Glucose 72 70 - 99 mg/dL   BUN 14 8 - 27 mg/dL   Creatinine, Ser 8.87 0.76 - 1.27 mg/dL   eGFR 71 >40 fO/fpw/8.26   BUN/Creatinine Ratio 13 10 - 24   Sodium 143 134 - 144 mmol/L   Potassium 4.8 3.5 - 5.2 mmol/L   Chloride 103 96 - 106 mmol/L   CO2 19 (L) 20 - 29 mmol/L   Calcium  9.4 8.6 - 10.2 mg/dL   Total Protein 6.7 6.0 - 8.5 g/dL   Albumin 4.4 3.9 - 4.9 g/dL   Globulin, Total 2.3 1.5 - 4.5 g/dL   Bilirubin Total 0.4 0.0 - 1.2 mg/dL   Alkaline Phosphatase 63 47 - 123 IU/L   AST 15 0 - 40 IU/L   ALT 13 0 - 44 IU/L  Lipid Panel w/o Chol/HDL Ratio     Status: None  Collection Time: 05/08/24 10:06 AM  Result Value Ref Range   Cholesterol, Total 125 100 - 199 mg/dL   Triglycerides 41 0 - 149 mg/dL   HDL 49 >60 mg/dL   VLDL Cholesterol Cal 10 5 - 40 mg/dL   LDL Chol Calc (NIH) 66 0 - 99 mg/dL  Hemoglobin J8r     Status: Abnormal   Collection Time: 05/08/24 10:06 AM  Result Value Ref Range   Hgb A1c MFr Bld 6.7 (H) 4.8 - 5.6 %    Comment:          Prediabetes: 5.7 - 6.4          Diabetes: >6.4          Glycemic control for adults with diabetes: <7.0    Est. average glucose Bld gHb Est-mCnc  146 mg/dL  CBC with Diff     Status: Abnormal   Collection Time: 05/08/24 10:06 AM  Result Value Ref Range   WBC 7.1 3.4 - 10.8 x10E3/uL   RBC 3.85 (L) 4.14 - 5.80 x10E6/uL   Hemoglobin 12.1 (L) 13.0 - 17.7 g/dL   Hematocrit 64.0 (L) 62.4 - 51.0 %   MCV 93 79 - 97 fL   MCH 31.4 26.6 - 33.0 pg   MCHC 33.7 31.5 - 35.7 g/dL   RDW 86.2 88.3 - 84.5 %   Platelets 256 150 - 450 x10E3/uL   Neutrophils 63 Not Estab. %   Lymphs 21 Not Estab. %   Monocytes 10 Not Estab. %   Eos 5 Not Estab. %   Basos 1 Not Estab. %   Neutrophils Absolute 4.4 1.4 - 7.0 x10E3/uL   Lymphocytes Absolute 1.5 0.7 - 3.1 x10E3/uL   Monocytes Absolute 0.7 0.1 - 0.9 x10E3/uL   EOS (ABSOLUTE) 0.3 0.0 - 0.4 x10E3/uL   Basophils Absolute 0.1 0.0 - 0.2 x10E3/uL   Immature Granulocytes 0 Not Estab. %   Immature Grans (Abs) 0.0 0.0 - 0.1 x10E3/uL  PSA     Status: None   Collection Time: 05/08/24 10:06 AM  Result Value Ref Range   Prostate Specific Ag, Serum <0.1 0.0 - 4.0 ng/mL    Comment: Roche ECLIA methodology. According to the American Urological Association, Serum PSA should decrease and remain at undetectable levels after radical prostatectomy. The AUA defines biochemical recurrence as an initial PSA value 0.2 ng/mL or greater followed by a subsequent confirmatory PSA value 0.2 ng/mL or greater. Values obtained with different assay methods or kits cannot be used interchangeably. Results cannot be interpreted as absolute evidence of the presence or absence of malignant disease.   POCT CBG (Fasting - Glucose)     Status: Abnormal   Collection Time: 05/28/24  9:36 AM  Result Value Ref Range   Glucose Fasting, POC 104 (A) 70 - 99 mg/dL  POC CREATINE & ALBUMIN,URINE     Status: Normal   Collection Time: 05/28/24  9:55 AM  Result Value Ref Range   Microalbumin Ur, POC 10 mg/L   Creatinine, POC 10 mg/dL   Albumin/Creatinine Ratio, Urine, POC <30       Assessment & Plan:  Continue medications as prescribed.  Reinforced healthy diet and exercise as tolerated. Will order sleep study if needed. Order replacement CPAP machine as patients machine is over 4 years old and malfunctioning. Check UAC today. Recommend patient complete hemoccult stool cards for recent Hbg drop on labs. Problem List Items Addressed This Visit     Essential hypertension, benign - Primary  Relevant Medications   amLODipine -benazepril  (LOTREL) 5-10 MG capsule   OSA on CPAP   Mixed hyperlipidemia   Relevant Medications   amLODipine -benazepril  (LOTREL) 5-10 MG capsule   Type 2 diabetes mellitus without complication, without long-term current use of insulin (HCC)   Relevant Medications   amLODipine -benazepril  (LOTREL) 5-10 MG capsule   Other Relevant Orders   POCT CBG (Fasting - Glucose) (Completed)   POC CREATINE & ALBUMIN,URINE (Completed)   Overweight with body mass index (BMI) of 28 to 28.9 in adult    Return in about 3 months (around 08/26/2024) for AWV.   Total time spent: 25 minutes. This time includes review of previous notes and results and patient face to face interaction during today's visit.    Spencer FREDY RAMAN, MD  05/28/2024   This document may have been prepared by [facility] Voice Recognition software and as such may include unintentional dictation errors.

## 2024-06-30 ENCOUNTER — Other Ambulatory Visit: Payer: Self-pay | Admitting: Internal Medicine

## 2024-06-30 DIAGNOSIS — F419 Anxiety disorder, unspecified: Secondary | ICD-10-CM

## 2024-06-30 DIAGNOSIS — I10 Essential (primary) hypertension: Secondary | ICD-10-CM

## 2024-07-07 ENCOUNTER — Encounter: Payer: Self-pay | Admitting: Internal Medicine

## 2024-08-24 ENCOUNTER — Other Ambulatory Visit

## 2024-08-25 ENCOUNTER — Encounter: Payer: Medicare Other | Admitting: Dermatology

## 2024-08-27 ENCOUNTER — Ambulatory Visit: Admitting: Internal Medicine

## 2024-09-03 ENCOUNTER — Ambulatory Visit: Admitting: Urology
